# Patient Record
Sex: Male | Born: 1960 | Hispanic: No | Marital: Married | Smoking: Never smoker
Health system: Southern US, Community
[De-identification: ages and names within clinical notes are randomized; demographics above are authoritative.]

## PROBLEM LIST (undated history)

## (undated) DIAGNOSIS — E119 Type 2 diabetes mellitus without complications: Secondary | ICD-10-CM

## (undated) HISTORY — DX: Type 2 diabetes mellitus without complications: E11.9

---

## 2008-04-27 ENCOUNTER — Ambulatory Visit: Payer: Self-pay | Admitting: Cardiovascular Disease

## 2008-04-29 ENCOUNTER — Ambulatory Visit: Payer: Self-pay | Admitting: Cardiology

## 2008-05-18 ENCOUNTER — Ambulatory Visit: Payer: Self-pay

## 2008-05-18 ENCOUNTER — Encounter: Payer: Self-pay | Admitting: Cardiovascular Disease

## 2010-03-14 ENCOUNTER — Encounter
Admission: RE | Admit: 2010-03-14 | Discharge: 2010-03-19 | Payer: Self-pay | Source: Home / Self Care | Attending: Optometry | Admitting: Optometry

## 2010-05-15 IMAGING — CT CT ANGIO CHEST
2 of 7 series · 20 of 46 positions shown · IV contrast (Omnipaque 300)
Comparison: none

CLINICAL DATA: Chest pain.  Reason for exam: rule out PE.

CT ANGIOGRAPHY CHEST
TECHNIQUE: Multidetector CT imaging of the chest using the
standard protocol during bolus administration of intravenous
contrast. Multiplanar reconstructed images including MIPs were
obtained and reviewed to evaluate the vascular anatomy.
Contrast: 80 ml 8mnipaque-HJJ IV.

[Series 6: pe 1.0 b20f st · axial · 0.68mm/px · z∈[-253,-29]mm · 17 of 246 slices shown]
[im 11/246  lung]
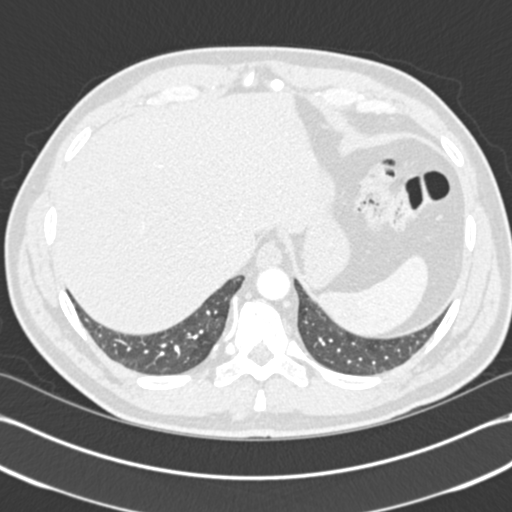
[im 22/246  soft-tissue]
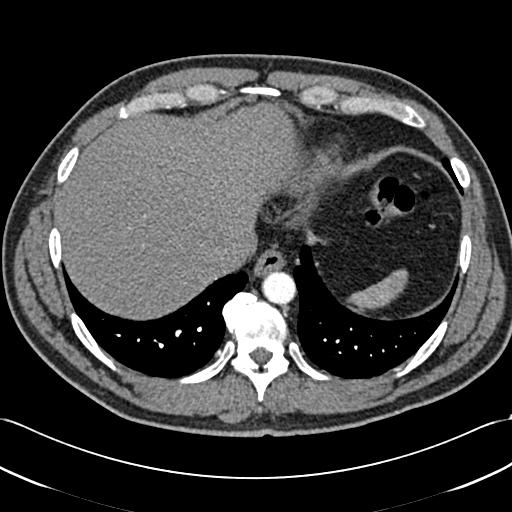
[im 43/246  lung]
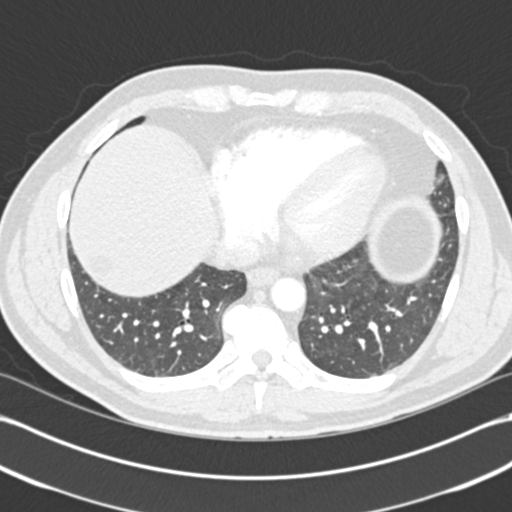
[im 54/246  soft-tissue]
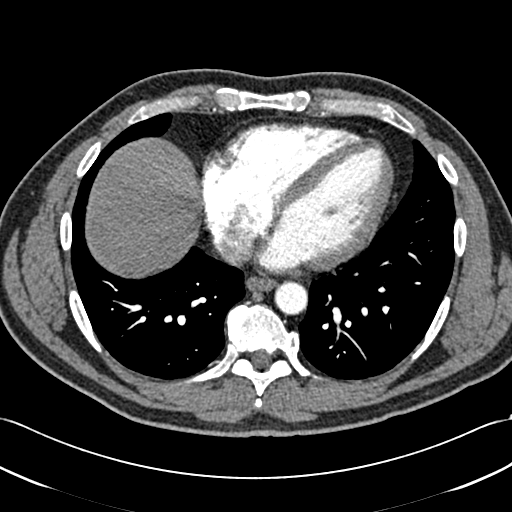
[im 64/246  lung]
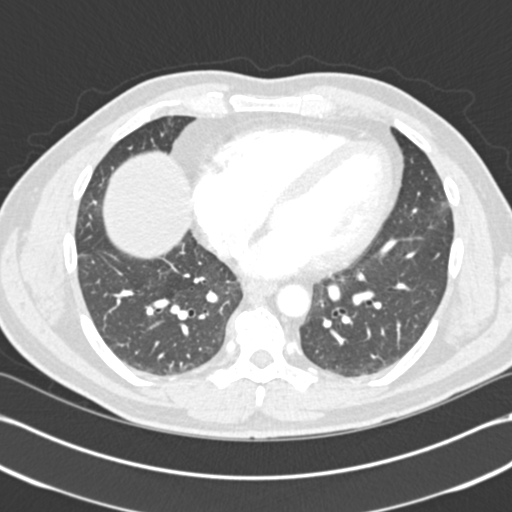
[im 86/246  soft-tissue]
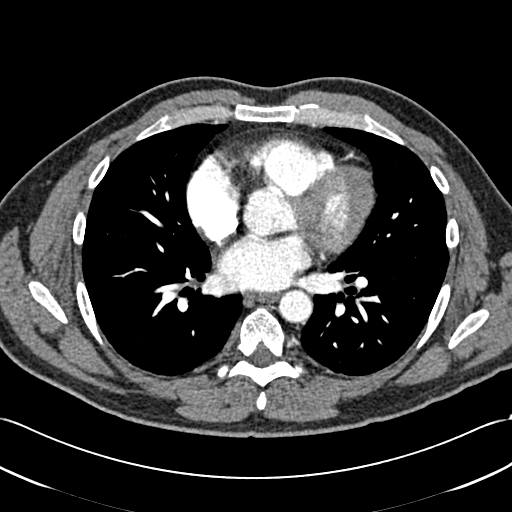
[im 96/246  lung]
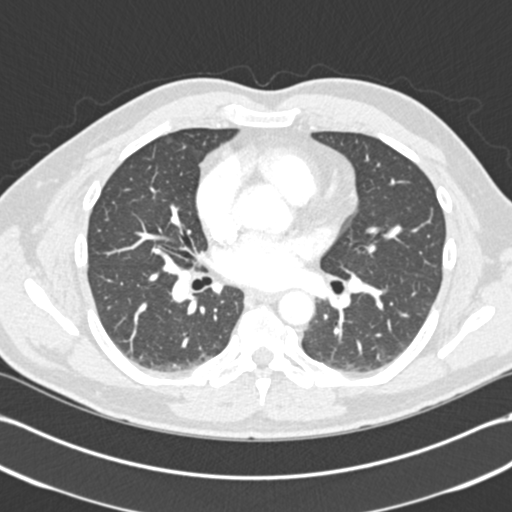
[im 107/246  soft-tissue]
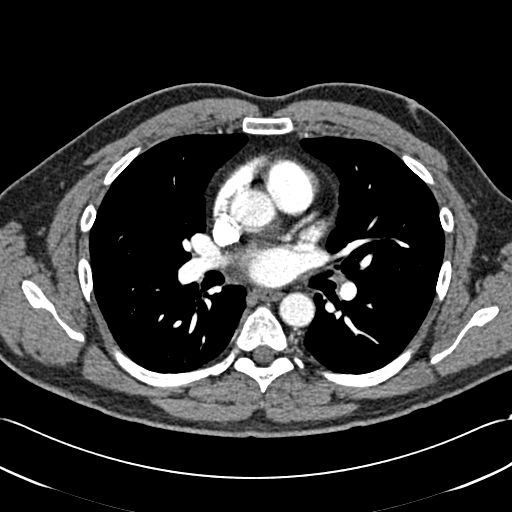
[im 128/246  lung]
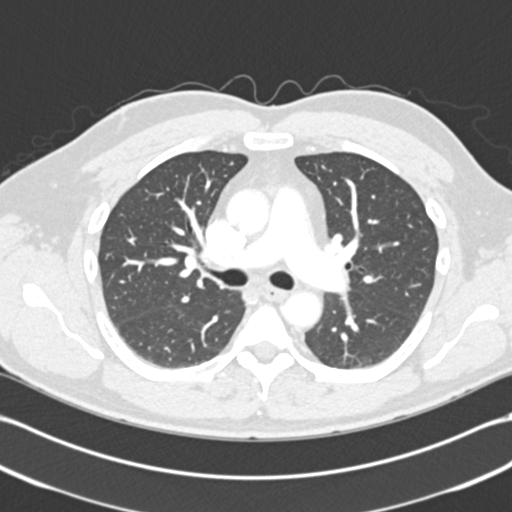
[im 139/246  soft-tissue]
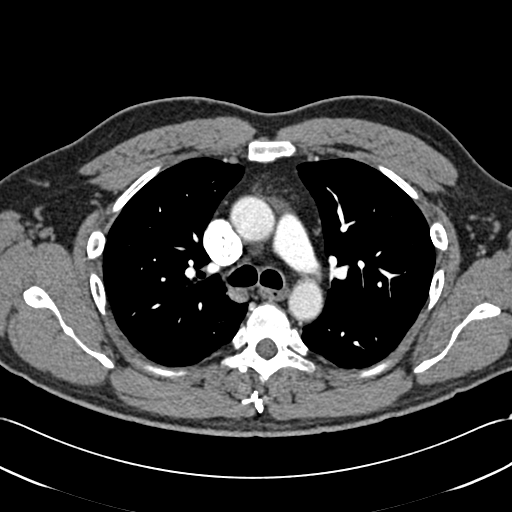
[im 150/246  lung]
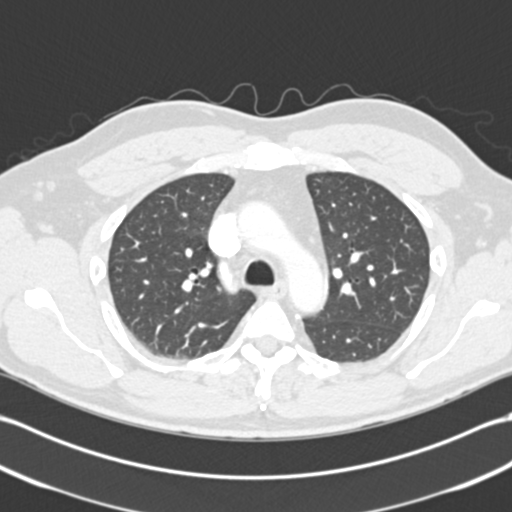
[im 160/246  soft-tissue]
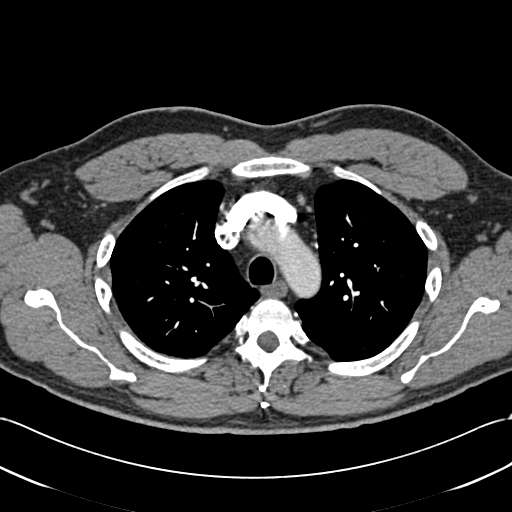
[im 182/246  lung]
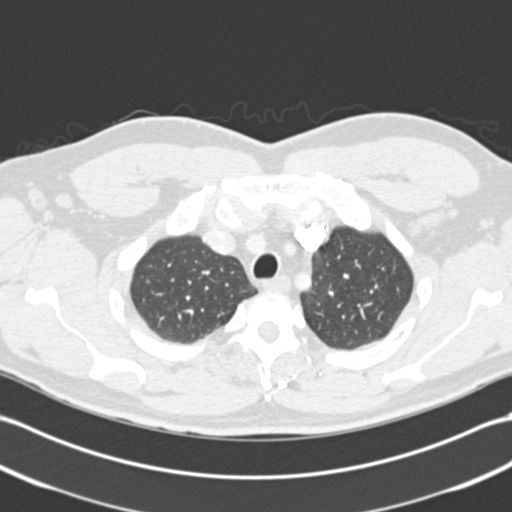
[im 192/246  soft-tissue]
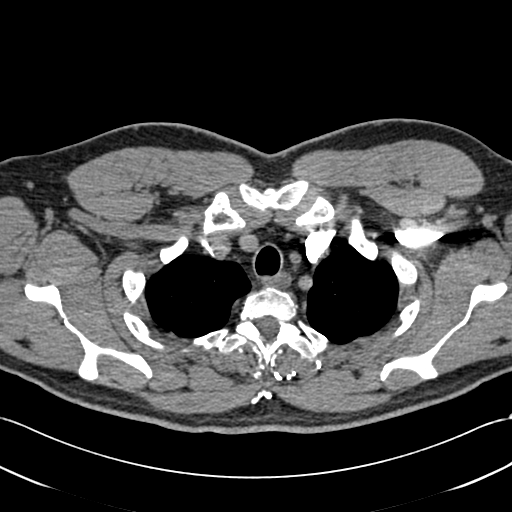
[im 203/246  lung]
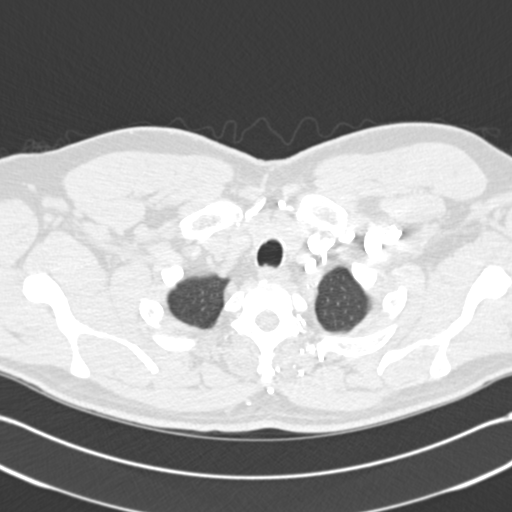
[im 224/246  soft-tissue]
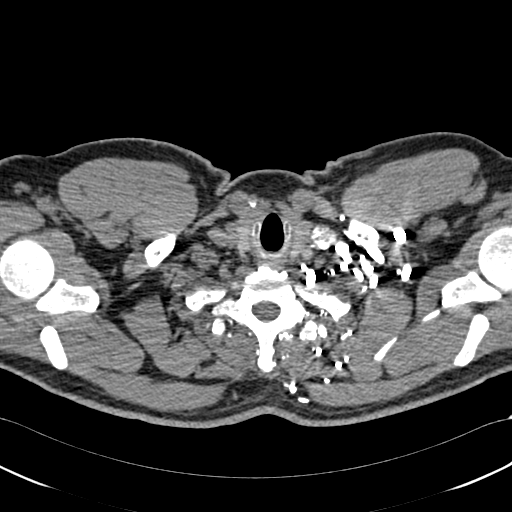
[im 235/246  lung]
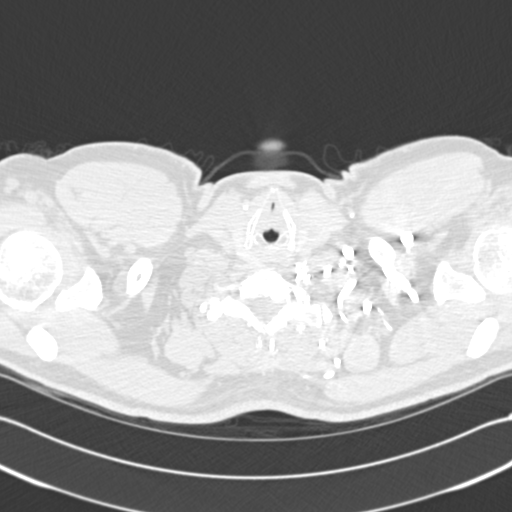

[Series 606: <mpr thick range> · coronal · 0.68mm/px · 3 of 113 slices shown]
[im 29/113  soft-tissue]
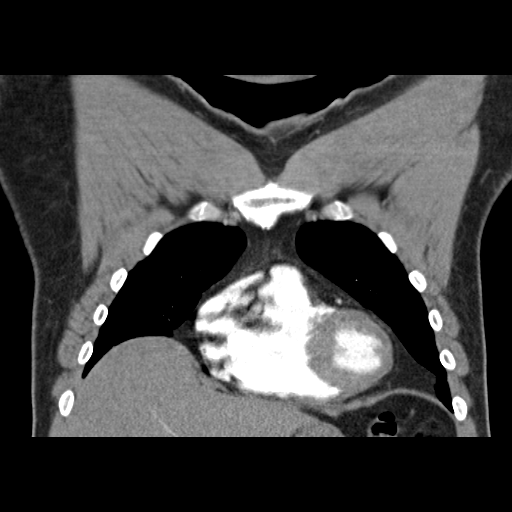
[im 57/113  soft-tissue]
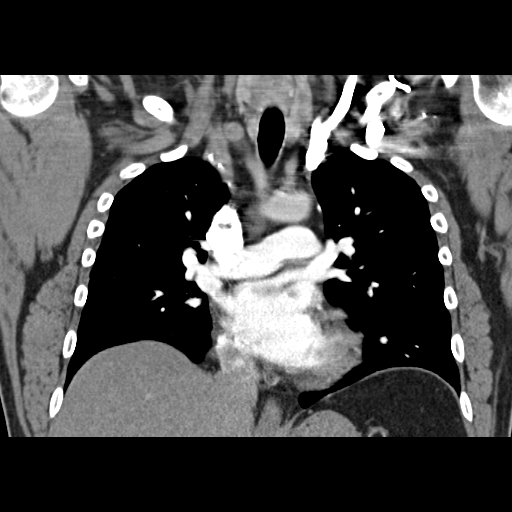
[im 85/113  soft-tissue]
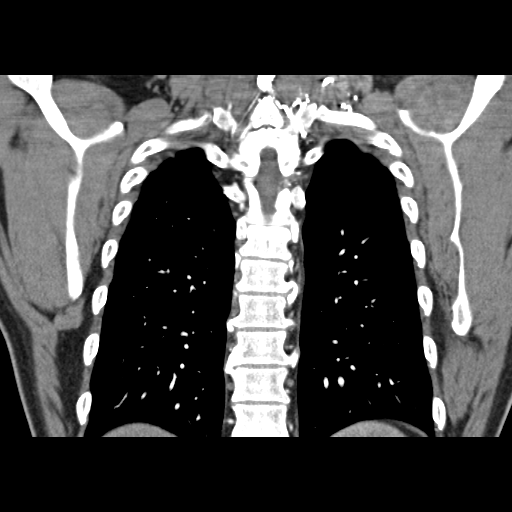

[20 of 46 positions shown; findings below may reference images not displayed]

FINDINGS: Negative for PE.  No acute pulmonary process.  Thoracic
aorta unremarkable.  No mediastinal mass or adenopathy. 1.6 cm cyst
in the superior aspect of the right hepatic lobe.
IMPRESSION: No acute chest findings.  Negative for PE.

## 2010-10-23 NOTE — Assessment & Plan Note (Signed)
[facility]                            CARDIOLOGY OFFICE NOTE   Warren, Quinn                           MRN:          [id number]  DATE:04/27/2008                            DOB:          01/19/1961    HISTORY OF PRESENT ILLNESS:  Warren Quinn is referred today by [facility].  The patient was seen there on November 16 for chest pain.  He is 50  years old.  He has coronary risk factors and positive family history.  He has had chest pain and shortness of breath of left 2-3 weeks.  He  denies any fever.  There is a mild pleuritic component to it.  There is  a mild positional component to it.  It does occur with exercise.  He had  a little bit of stress recently.  Apparently, there is some men selling  illegal meat around the neighborhood, and he had to call the sheriff's  department on them.   Other than this, he seems to be fairly level headed and not prone to  anxiety.  The patient's pain has been somewhat progressive over the last  2-3 weeks.  He was seen at [facility].  Chest x-ray showed no active  disease.  His blood work was fine.  He had no acute changes on his EKG,  and he was referred here.  The patient does not have a history of  asthma.  There has been no DVT or PE.  However, he clearly has pain with  movement and sometimes with a deep breath.  He has no previous history  of pericarditis or pleurisy.   The patient has not had a recent flu shot.   REVIEW OF SYSTEMS:  Otherwise, negative, in particular he has not had  palpitations or syncope.   PAST MEDICAL HISTORY:  Otherwise benign.  He has had some moles  surgically removed from his back, otherwise negative.   MEDICATIONS:  He takes no medications.   FAMILY HISTORY:  Unremarkable.   SOCIAL HISTORY:  He has a twin sister.  He is happily married.  He has  two older children.  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  He does not  smoke or drink.   ALLERGIES:  NO KNOWN DRUG ALLERGIES.   PHYSICAL EXAMINATION:  GENERAL:  Remarkable for healthy-appearing young  white male in no distress.  VITAL SIGNS:  Blood pressure is 128/86, pulse 77 and regular,  respiratory 14, afebrile, weight 201.  HEENT:  Unremarkable.  NECK:  Carotids are normal without bruit.  No lymphadenopathy,  thyromegaly, JVP elevation.  LUNGS:  Clear with diaphragmatic motion.  No wheezing.  HEART:  S1-S2 normal.  Heart sounds PMI normal.  ABDOMEN:  Benign.  Bowel sounds positive.  No AAA.  No tenderness, no  bruit, no hepatosplenomegaly or hepatojugular reflux  tenderness.  EXTREMITIES:  Distal pulses are intact.  No edema.  NEUROLOGICAL:  Nonfocal.  SKIN:  Warm and dry.  No muscular weakness.  Skin currently shows some  small moles on the back, but nothing that looks irregular.  IMPRESSION:  1. Chest pain, atypical, follow-up stress echo.  2. Shortness of breath with some pleuritic component.  Check CT, rule      out pulmonary emboli.  3. History of skin moles.  Back currently looks fine.  Follow up with      dermatologist on a yearly basis.  4. Stress.  The patient does admit to some stress.  I do not think he      needs any medication for this.  He seems to have a good coping      mechanism.  In particular, he enjoys his mission work and goes to      [place] twice a year.  As long as his chest CT and stress echo      were normal, he will be seen on a p.r.n. basis.     Warren Quinn. Warren Emms, MD, [facility]  Electronically Signed    PCN/MedQ  DD: 04/27/2008  DT: 04/27/2008  Job #: [id number]

## 2011-12-30 DIAGNOSIS — E119 Type 2 diabetes mellitus without complications: Secondary | ICD-10-CM | POA: Insufficient documentation

## 2012-04-13 DIAGNOSIS — E785 Hyperlipidemia, unspecified: Secondary | ICD-10-CM | POA: Insufficient documentation

## 2018-03-10 ENCOUNTER — Ambulatory Visit: Payer: Self-pay | Admitting: Nurse Practitioner

## 2018-03-10 DIAGNOSIS — E119 Type 2 diabetes mellitus without complications: Secondary | ICD-10-CM

## 2018-03-10 DIAGNOSIS — R110 Nausea: Secondary | ICD-10-CM

## 2018-03-10 LAB — GLUCOSE, FASTING: Blood Glucose, Fasting: 92

## 2018-03-10 NOTE — Patient Instructions (Addendum)
Diabetes Mellitus and Nutrition When you have diabetes (diabetes mellitus), it is very important to have healthy eating habits because your blood sugar (glucose) levels are greatly affected by what you eat and drink. Eating healthy foods in the appropriate amounts, at about the same times every day, can help you:  Control your blood glucose.  Lower your risk of heart disease.  Improve your blood pressure.  Reach or maintain a healthy weight.  Every person with diabetes is different, and each person has different needs for a meal plan. Your health care provider may recommend that you work with a diet and nutrition specialist (dietitian) to make a meal plan that is best for you. Your meal plan may vary depending on factors such as:  The calories you need.  The medicines you take.  Your weight.  Your blood glucose, blood pressure, and cholesterol levels.  Your activity level.  Other health conditions you have, such as heart or kidney disease.  How do carbohydrates affect me? Carbohydrates affect your blood glucose level more than any other type of food. Eating carbohydrates naturally increases the amount of glucose in your blood. Carbohydrate counting is a method for keeping track of how many carbohydrates you eat. Counting carbohydrates is important to keep your blood glucose at a healthy level, especially if you use insulin or take certain oral diabetes medicines. It is important to know how many carbohydrates you can safely have in each meal. This is different for every person. Your dietitian can help you calculate how many carbohydrates you should have at each meal and for snack. Foods that contain carbohydrates include:  Bread, cereal, rice, pasta, and crackers.  Potatoes and corn.  Peas, beans, and lentils.  Milk and yogurt.  Fruit and juice.  Desserts, such as cakes, cookies, ice cream, and candy.  How does alcohol affect me? Alcohol can cause a sudden decrease in blood  glucose (hypoglycemia), especially if you use insulin or take certain oral diabetes medicines. Hypoglycemia can be a life-threatening condition. Symptoms of hypoglycemia (sleepiness, dizziness, and confusion) are similar to symptoms of having too much alcohol. If your health care provider says that alcohol is safe for you, follow these guidelines:  Limit alcohol intake to no more than 1 drink per day for nonpregnant women and 2 drinks per day for men. One drink equals 12 oz of beer, 5 oz of wine, or 1 oz of hard liquor.  Do not drink on an empty stomach.  Keep yourself hydrated with water, diet soda, or unsweetened iced tea.  Keep in mind that regular soda, juice, and other mixers may contain a lot of sugar and must be counted as carbohydrates.  What are tips for following this plan? Reading food labels  Start by checking the serving size on the label. The amount of calories, carbohydrates, fats, and other nutrients listed on the label are based on one serving of the food. Many foods contain more than one serving per package.  Check the total grams (g) of carbohydrates in one serving. You can calculate the number of servings of carbohydrates in one serving by dividing the total carbohydrates by 15. For example, if a food has 30 g of total carbohydrates, it would be equal to 2 servings of carbohydrates.  Check the number of grams (g) of saturated and trans fats in one serving. Choose foods that have low or no amount of these fats.  Check the number of milligrams (mg) of sodium in one serving. Most people   should limit total sodium intake to less than 2,300 mg per day.  Always check the nutrition information of foods labeled as "low-fat" or "nonfat". These foods may be higher in added sugar or refined carbohydrates and should be avoided.  Talk to your dietitian to identify your daily goals for nutrients listed on the label. Shopping  Avoid buying canned, premade, or processed foods. These  foods tend to be high in fat, sodium, and added sugar.  Shop around the outside edge of the grocery store. This includes fresh fruits and vegetables, bulk grains, fresh meats, and fresh dairy. Cooking  Use low-heat cooking methods, such as baking, instead of high-heat cooking methods like deep frying.  Cook using healthy oils, such as olive, canola, or sunflower oil.  Avoid cooking with butter, cream, or high-fat meats. Meal planning  Eat meals and snacks regularly, preferably at the same times every day. Avoid going long periods of time without eating.  Eat foods high in fiber, such as fresh fruits, vegetables, beans, and whole grains. Talk to your dietitian about how many servings of carbohydrates you can eat at each meal.  Eat 4-6 ounces of lean protein each day, such as lean meat, chicken, fish, eggs, or tofu. 1 ounce is equal to 1 ounce of meat, chicken, or fish, 1 egg, or 1/4 cup of tofu.  Eat some foods each day that contain healthy fats, such as avocado, nuts, seeds, and fish. Lifestyle   Check your blood glucose regularly.  Exercise at least 30 minutes 5 or more days each week, or as told by your health care provider.  Take medicines as told by your health care provider.  Do not use any products that contain nicotine or tobacco, such as cigarettes and e-cigarettes. If you need help quitting, ask your health care provider.  Work with a Veterinary surgeon or diabetes educator to identify strategies to manage stress and any emotional and social challenges. What are some questions to ask my health care provider?  Do I need to meet with a diabetes educator?  Do I need to meet with a dietitian?  What number can I call if I have questions?  When are the best times to check my blood glucose? Where to find more information:  American Diabetes Association: diabetes.org/food-and-fitness/food  Academy of Nutrition and Dietetics:  https://www.vargas.com/  [employer] of Diabetes and Digestive and Kidney Diseases (NIH): FindJewelers.cz Summary  A healthy meal plan will help you control your blood glucose and maintain a healthy lifestyle.  Working with a diet and nutrition specialist (dietitian) can help you make a meal plan that is best for you.  Keep in mind that carbohydrates and alcohol have immediate effects on your blood glucose levels. It is important to count carbohydrates and to use alcohol carefully. This information is not intended to replace advice given to you by your health care provider. Make sure you discuss any questions you have with your health care provider. Document Released: 02/21/2005 Document Revised: 07/01/2016 Document Reviewed: 07/01/2016 Elsevier Interactive Patient Education  2018 [employer].  FBG done today Consider seeing a nutritionist and discuss with PCP if he feels he wants to know about heatlhier food choices; if a1c hasn't improved. Healthy eating habits and to continue to exercise as tolerated Continue routine follow up with dentist/ophthalmology Keep appt with PCP next week for f/u RTC PRN

## 2018-03-10 NOTE — Progress Notes (Signed)
   Subjective:    Patient ID: Warren Quinn, male    DOB: 03-28-1961, 57 y.o.   MRN: [id number]  HPI Marcas is here today and wants to discuss his diabetes and to have his glucose checked. He denies any dizziness, blurred vision, c/p, SOB, excessive thirst/urination or hunger. Denies any numbness or tingling in hands or feet but reports he's had this at times in the past. He is to f/u with PCP next week for routing eval of diabetes/hyperlipidemia. His job is very active working in maintenance, and he exercises daily before work for 20 minutes. Reports tried to follow low carb diet and had cut out breads, doesn't drink any sugar drinks but has unsweetened tea.   C/o some nausea last week which resolved 2 days ago and denies any abdominal pain and has regular bowel patterns. He reports he was under a lot of stress last week and denies any new foods   Takes his meds as directed and tolerates. Although admits takes metformin 2 twice daily; "I don't think I could handle them big pills at one time"   Review of Systems  Constitutional: Negative for fatigue, fever and unexpected weight change.  Respiratory: Negative for shortness of breath.   Cardiovascular: Negative for chest pain.  Gastrointestinal: Negative for abdominal pain.       Nausea resolved  Endocrine: Negative for polydipsia, polyphagia and polyuria.       Objective:   Physical Exam  Constitutional: He is oriented to person, place, and time. He appears well-developed and well-nourished.  HENT:  Head: Normocephalic.  Neck: Normal range of motion.  Cardiovascular: Normal rate, regular rhythm and normal heart sounds.  No murmur heard. No carotid bruits, no lower extremity edema  Pulmonary/Chest: Effort normal and breath sounds normal. No respiratory distress.  Neurological: He is alert and oriented to person, place, and time.  Skin: Skin is warm and dry.  Psychiatric: He has a normal mood and affect.  Vitals reviewed.          Assessment & Plan:

## 2018-05-21 ENCOUNTER — Ambulatory Visit: Payer: Self-pay | Admitting: Nurse Practitioner

## 2018-05-21 VITALS — BP 142/78 | HR 83 | Temp 98.1°F | Resp 16

## 2018-05-21 DIAGNOSIS — L089 Local infection of the skin and subcutaneous tissue, unspecified: Secondary | ICD-10-CM

## 2018-05-21 MED ORDER — MUPIROCIN 2 % EX OINT: TOPICAL_OINTMENT | CUTANEOUS | 0 refills | Status: DC

## 2018-05-21 NOTE — Patient Instructions (Signed)
Keep area clean and dry Apply topical antibiotic as directed If no improvement return to clinic Return to clinic next week for fasting glucose

## 2018-05-21 NOTE — Progress Notes (Signed)
   Subjective:    Patient ID: Warren Quinn, male    DOB: May 08, 1961, 57 y.o.   MRN: [id number]  HPI Warren Quinn is here today with c/o scratch to right forearm that is reddened and was tender to touch but has resolved. Admits tetanus in last 5 years and chart review shows 12/29/2014. Denies SOB, c/p, drainage to area or fever. Has applied vaseline with no relief.  Wanted to get glucose checked but has just eaten.    Review of Systems  Constitutional: Negative for activity change and fever.  Skin: Positive for color change.       Right forearm scratch that is reddened       Objective:   Physical Exam Constitutional:      Appearance: Normal appearance.  HENT:     Head: Normocephalic.  Neck:     Musculoskeletal: Neck supple.  Cardiovascular:     Rate and Rhythm: Normal rate.  Skin:    General: Skin is warm and dry.     Comments: Right forearm abrasion to posterior aspect that has erythema along abrasion. No drainage, tenderness on palpation noted.  Neurological:     Mental Status: He is alert.  Psychiatric:        Mood and Affect: Mood normal.           Assessment & Plan:

## 2018-05-26 ENCOUNTER — Ambulatory Visit: Payer: Self-pay | Admitting: Nurse Practitioner

## 2018-05-26 VITALS — BP 138/82 | HR 66 | Temp 98.1°F | Resp 16

## 2018-05-26 DIAGNOSIS — E119 Type 2 diabetes mellitus without complications: Secondary | ICD-10-CM

## 2018-05-26 LAB — POCT CBG (FASTING - GLUCOSE)-MANUAL ENTRY: Glucose Fasting, POC: 121 mg/dL — AB (ref 70–99)

## 2018-05-26 NOTE — Patient Instructions (Addendum)
Follow up with your provider next month for routine chronic office visit Continue all meds as directed Continue to exercise as tolerated and modify carb intake Your fasting glucose was 121 today Return to clinic as needed

## 2018-05-26 NOTE — Progress Notes (Signed)
   Subjective:    Patient ID: Warren FisherJack M Quinn, male    DOB: 14-May-1961, 57 y.o.   MRN: [id number]  HPI Warren Quinn is here this morning to get his glucose checked and he is fasting. He admits he hasn't eaten very well but is trying to be mindful of high carb foods. Continues to exercise daily. Was seen last week for skin infection to left forearm and area looking much better. Denies any excessive urination, thirst or urinary frequency. Denies any fever. Admits takes meds prescribed by PCP as directed and tolerating.  Review of Systems  Constitutional: Negative for chills, fatigue and fever.  Endocrine: Negative for polydipsia and polyuria.  Skin:       Right arm scratch is better       Objective:   Physical Exam Constitutional:      General: He is not in acute distress.    Appearance: Normal appearance. He is not ill-appearing.  HENT:     Head: Normocephalic.  Neck:     Musculoskeletal: Normal range of motion and neck supple.  Pulmonary:     Effort: Pulmonary effort is normal.  Musculoskeletal:     Comments: Normal gait  Skin:    General: Skin is warm.     Comments: Right forearm abrasion improving with some erythema still present along the linear abrasion but scabbing. No tenderness, drainage, or edema.  Neurological:     Mental Status: He is alert and oriented to person, place, and time.  Psychiatric:        Mood and Affect: Mood normal.           Assessment & Plan:

## 2018-05-26 NOTE — Progress Notes (Deleted)
f °

## 2019-08-24 ENCOUNTER — Ambulatory Visit: Payer: Self-pay | Admitting: Family Medicine

## 2019-08-24 VITALS — BP 130/82 | HR 77 | Ht 70.0 in | Wt 190.0 lb

## 2019-08-24 DIAGNOSIS — Z789 Other specified health status: Secondary | ICD-10-CM

## 2019-08-24 NOTE — Progress Notes (Signed)
Subjective:     Patient ID: Warren Quinn, male   DOB: 06-19-1960, 59 y.o.   MRN: [id number]  HPI Warren Quinn presents to the employee health clinic today for his required wellness visit for his insurance. His PCP is Warren Quinn, [place], who he sees regularly for management of his T2DM. He reports his diabetes is under good control. He gets regular eye exams. His colonoscopy and prostate cancer screening is UTD. He denies any current problems or concerns.   Past Medical History:  Diagnosis Date  . Diabetes mellitus without complication (HCC)    No Known Allergies  Current Outpatient Medications:  .  atorvastatin (LIPITOR) 10 MG tablet, TAKE 1 TABLET BY MOUTH AT BEDTIME, Disp: , Rfl:  .  lisinopril (PRINIVIL,ZESTRIL) 5 MG tablet, TAKE 1 TABLET (5 MG TOTAL) BY MOUTH DAILY., Disp: , Rfl:  .  metFORMIN (GLUCOPHAGE-XR) 500 MG 24 hr tablet, Take 4 tabs once a day, Disp: , Rfl:  .  Blood Glucose Monitoring Suppl DEVI, by Does not apply route., Disp: , Rfl:  .  glucose blood test strip, 1 each by Other route 2 (two) times daily. Use as instructed, Disp: , Rfl:  .  mupirocin ointment (BACTROBAN) 2 %, Apply to affected area twice daily x 7 days, Disp: 22 g, Rfl: 0     Review of Systems  Constitutional: Negative for chills, fatigue, fever and unexpected weight change.  HENT: Negative for congestion, ear pain, sinus pressure, sinus pain and sore throat.   Eyes: Negative for discharge and visual disturbance.  Respiratory: Negative for cough, shortness of breath and wheezing.   Cardiovascular: Negative for chest pain and leg swelling.  Gastrointestinal: Negative for abdominal pain, blood in stool, constipation, diarrhea, nausea and vomiting.  Genitourinary: Negative for difficulty urinating and hematuria.  Skin: Negative for color change.  Neurological: Negative for dizziness, weakness, light-headedness and headaches.  Hematological: Negative for adenopathy.  All other systems reviewed and are  negative.      Objective:   Physical Exam Vitals reviewed.  Constitutional:      General: He is not in acute distress.    Appearance: Normal appearance. He is well-developed.  HENT:     Head: Normocephalic and atraumatic.  Eyes:     General:        Right eye: No discharge.        Left eye: No discharge.  Cardiovascular:     Rate and Rhythm: Normal rate and regular rhythm.     Heart sounds: Normal heart sounds.  Pulmonary:     Effort: Pulmonary effort is normal. No respiratory distress.     Breath sounds: Normal breath sounds.  Musculoskeletal:     Cervical back: Neck supple.  Skin:    General: Skin is warm and dry.  Neurological:     Mental Status: He is alert and oriented to person, place, and time.  Psychiatric:        Mood and Affect: Mood normal.        Behavior: Behavior normal.    Today's Vitals   08/24/19 1332  BP: 130/82  Pulse: 77  SpO2: 96%  Weight: 190 lb (86.2 kg)  Height: 5\' 10"  (1.778 m)   Body mass index is 27.26 kg/m.     Assessment:     Participant in health and wellness plan      Plan:     1. Keep all regular appts with PCP 2. Encouraged continued efforts at healthy diet and exercise.  3.  F/u here prn.

## 2019-11-02 ENCOUNTER — Ambulatory Visit: Payer: Self-pay | Admitting: Family Medicine

## 2019-11-02 VITALS — BP 124/84 | HR 71

## 2019-11-02 DIAGNOSIS — M79674 Pain in right toe(s): Secondary | ICD-10-CM

## 2019-11-02 DIAGNOSIS — E119 Type 2 diabetes mellitus without complications: Secondary | ICD-10-CM

## 2019-11-02 NOTE — Progress Notes (Signed)
Subjective:     Patient ID: Warren Quinn, male   DOB: [birth year], 59 y.o.   MRN: [id number]  HPI Shahzad presents to the employee health clinic today for evaluation of right fourth toe pain. He reports he jammed it on his rocking chair yesterday at home. Able to ambulate without problem. Reports swelling and bruising to right fourth toe, pain at the DIP joint. Denies numbness or tingling.   Brenton would also like some lab work done to check on his Type 2 diabetes. He states he needs to schedule a f/u appt with his PCP and would like to have lab work done prior. He reports he is compliant with his medication. Tries to eat healthy and exercise. Reports his fasting blood sugars at home are around 130. Denies any problems with low blood sugars.   Past Medical History:  Diagnosis Date  . Diabetes mellitus without complication (HCC)    No Known Allergies  Current Outpatient Medications:  .  atorvastatin (LIPITOR) 10 MG tablet, TAKE 1 TABLET BY MOUTH AT BEDTIME, Disp: , Rfl:  .  lisinopril (PRINIVIL,ZESTRIL) 5 MG tablet, TAKE 1 TABLET (5 MG TOTAL) BY MOUTH DAILY., Disp: , Rfl:  .  metFORMIN (GLUCOPHAGE-XR) 500 MG 24 hr tablet, Take 4 tabs once a day, Disp: , Rfl:  .  Blood Glucose Monitoring Suppl DEVI, by Does not apply route., Disp: , Rfl:  .  glucose blood test strip, 1 each by Other route 2 (two) times daily. Use as instructed, Disp: , Rfl:  .  mupirocin ointment (BACTROBAN) 2 %, Apply to affected area twice daily x 7 days, Disp: 22 g, Rfl: 0     Review of Systems  Constitutional: Negative for chills and fever.  Respiratory: Negative for cough, shortness of breath and wheezing.   Cardiovascular: Negative for chest pain and leg swelling.  Musculoskeletal: Positive for joint swelling (see hpi).  Neurological: Negative for dizziness, syncope, weakness and numbness.       Objective:   Physical Exam Vitals reviewed.  Constitutional:      General: He is not in acute distress.  Appearance: Normal appearance. He is not toxic-appearing.  HENT:     Head: Normocephalic and atraumatic.  Eyes:     General:        Right eye: No discharge.        Left eye: No discharge.  Cardiovascular:     Rate and Rhythm: Normal rate and regular rhythm.     Heart sounds: Normal heart sounds.  Pulmonary:     Effort: Pulmonary effort is normal. No respiratory distress.     Breath sounds: Normal breath sounds.  Musculoskeletal:     Cervical back: Neck supple.     Comments: Right fourth toe with edema and ecchymosis. Skin intact, no erythema or increased warmth to touch. Tender to palpation to right fourth DIP joint. Sensation intact. Ambulates without difficulty.   Skin:    General: Skin is warm and dry.  Neurological:     Mental Status: He is alert and oriented to person, place, and time.  Psychiatric:        Mood and Affect: Mood normal.        Behavior: Behavior normal.    Today's Vitals   11/02/19 0836  BP: 124/84  Pulse: 71  SpO2: 97%   There is no height or weight on file to calculate BMI.      Assessment:     1. Pain of toe of right foot  Discussed with patient that an x-ray is unlikely to be beneficial at this time. Recommend conservative treatment with buddy taping of the toe for increased comfort and support, application of ice and elevating the extremity as much as possible. May take otc acetaminophen for pain relief prn. If symptoms worsen or he sees no signs of improvement he should be seen by either his PCP or urgent care and obtain an x-ray at that time. He verbalized understanding.   2. Type 2 diabetes mellitus without complication, without long-term current use of insulin (HCC) Recommend obtaining lab work next week when he is fasting.  Will check lipid panel, CBC, CMP, A1c, and PSA.      Plan:     See above.

## 2019-11-16 ENCOUNTER — Other Ambulatory Visit: Payer: Self-pay | Admitting: Family Medicine

## 2019-11-17 LAB — COMPLETE METABOLIC PANEL WITH GFR
AG Ratio: 2.3 (calc) (ref 1.0–2.5)
ALT: 18 U/L (ref 9–46)
AST: 14 U/L (ref 10–35)
Albumin: 4.3 g/dL (ref 3.6–5.1)
Alkaline phosphatase (APISO): 68 U/L (ref 35–144)
BUN: 20 mg/dL (ref 7–25)
CO2: 23 mmol/L (ref 20–32)
Calcium: 9.3 mg/dL (ref 8.6–10.3)
Chloride: 107 mmol/L (ref 98–110)
Creat: 0.88 mg/dL (ref 0.70–1.25)
GFR, Est African American: 108 mL/min/{1.73_m2} (ref >=60)
GFR, Est Non African American: 93 mL/min/{1.73_m2} (ref >=60)
Globulin: 1.9 g/dL (calc) (ref 1.9–3.7)
Glucose, Bld: 115 mg/dL — ABNORMAL HIGH (ref 65–99)
Potassium: 4.4 mmol/L (ref 3.5–5.3)
Sodium: 139 mmol/L (ref 135–146)
Total Bilirubin: 0.7 mg/dL (ref 0.2–1.2)
Total Protein: 6.2 g/dL (ref 6.1–8.1)

## 2019-11-17 LAB — CBC WITH DIFFERENTIAL/PLATELET
Absolute Monocytes: 434 cells/uL (ref 200–950)
Basophils Absolute: 20 cells/uL (ref 0–200)
Basophils Relative: 0.4 %
Eosinophils Absolute: 51 cells/uL (ref 15–500)
Eosinophils Relative: 1 %
HCT: 45.8 % (ref 38.5–50.0)
Hemoglobin: 15.4 g/dL (ref 13.2–17.1)
Lymphs Abs: 1601 cells/uL (ref 850–3900)
MCH: 30 pg (ref 27.0–33.0)
MCHC: 33.6 g/dL (ref 32.0–36.0)
MCV: 89.1 fL (ref 80.0–100.0)
MPV: 10.2 fL (ref 7.5–12.5)
Monocytes Relative: 8.5 %
Neutro Abs: 2994 cells/uL (ref 1500–7800)
Neutrophils Relative %: 58.7 %
Platelets: 224 10*3/uL (ref 140–400)
RBC: 5.14 10*6/uL (ref 4.20–5.80)
RDW: 13 % (ref 11.0–15.0)
Total Lymphocyte: 31.4 %
WBC: 5.1 10*3/uL (ref 3.8–10.8)

## 2019-11-17 LAB — LIPID PANEL
Cholesterol: 122 mg/dL (ref <200)
HDL: 35 mg/dL — ABNORMAL LOW (ref >=40)
LDL Cholesterol (Calc): 71 mg/dL (calc)
Non-HDL Cholesterol (Calc): 87 mg/dL (calc) (ref <130)
Total CHOL/HDL Ratio: 3.5 (calc) (ref <5.0)
Triglycerides: 75 mg/dL (ref <150)

## 2019-11-17 LAB — PSA: PSA: 1.7 ng/mL (ref <=4.0)

## 2019-11-17 LAB — HEMOGLOBIN A1C W/OUT EAG: Hgb A1c MFr Bld: 6.6 % of total Hgb — ABNORMAL HIGH (ref <5.7)

## 2019-11-18 NOTE — Progress Notes (Signed)
Warren Quinn,  Lab work looks good.   Your cholesterol is under good control with your current medication. Your HDL, or the good cholesterol, is on the low side. This can be improved with increased physical activity.   Your diabetes is under good control with your current medication. Your A1c is at 6.6% which is below the goal of 7%. So that is great!   Your liver function, kidney function, blood counts, and PSA (which looks at your risk of prostate cancer) are all normal.   Please share these results with your primary care provider. If you have any questions please let me know!  Take care,  Jeannine Boga, NP

## 2020-02-22 ENCOUNTER — Ambulatory Visit: Payer: Self-pay | Admitting: Family Medicine

## 2020-02-22 VITALS — BP 126/80 | HR 85

## 2020-02-22 DIAGNOSIS — E119 Type 2 diabetes mellitus without complications: Secondary | ICD-10-CM

## 2020-02-22 MED ORDER — LISINOPRIL 5 MG PO TABS: ORAL_TABLET | ORAL | 1 refills | Status: DC

## 2020-02-22 MED ORDER — METFORMIN HCL ER 500 MG PO TB24: ORAL_TABLET | ORAL | 1 refills | Status: DC

## 2020-02-22 NOTE — Progress Notes (Signed)
  Subjective:     Patient ID: Warren Quinn, male   DOB: 04-03-1961, 59 y.o.   MRN: [id number]  HPI Jeremia presents to the employee health clinic today for short-term refills of his chronic medications. He has not been able to see his PCP d/t his work schedule. He is working on getting an appointment scheduled now.   He reports compliance with his current medications. Denies any adverse effects. He reports his fasting blood sugars have been in the 120s. He continues to work on healthy eating and exercise.   Review of Systems  Constitutional: Negative for activity change, appetite change, fatigue, fever and unexpected weight change.  HENT: Negative.   Eyes: Negative for photophobia and visual disturbance.  Respiratory: Negative for cough and shortness of breath.   Cardiovascular: Negative for chest pain, palpitations and leg swelling.  Gastrointestinal: Negative for abdominal pain, diarrhea, nausea and vomiting.  Genitourinary: Negative.   Musculoskeletal: Negative.   Neurological: Negative.        Objective:   Physical Exam Vitals reviewed.  Constitutional:      General: He is not in acute distress.    Appearance: Normal appearance. He is well-developed.  HENT:     Head: Normocephalic and atraumatic.  Eyes:     General:        Right eye: No discharge.        Left eye: No discharge.  Cardiovascular:     Rate and Rhythm: Normal rate and regular rhythm.     Heart sounds: Normal heart sounds.  Pulmonary:     Effort: Pulmonary effort is normal. No respiratory distress.     Breath sounds: Normal breath sounds.  Skin:    General: Skin is warm and dry.  Neurological:     Mental Status: He is alert and oriented to person, place, and time.  Psychiatric:        Mood and Affect: Mood normal.        Behavior: Behavior normal.      Today's Vitals   02/22/20 1410  BP: 126/80  Pulse: 85  SpO2: 98%   There is no height or weight on file to calculate BMI.     Assessment:      Type 2 diabetes mellitus without complication, without long-term current use of insulin (HCC)      Plan:     1. Lisinopril and metformin refilled. Pt states he does not need a refill on his atorvastatin at this time. Lab results from June reviewed. He should f/u with his PCP for further refills.

## 2020-03-02 ENCOUNTER — Other Ambulatory Visit: Payer: Self-pay

## 2020-03-02 ENCOUNTER — Ambulatory Visit
Admission: EM | Admit: 2020-03-02 | Discharge: 2020-03-02 | Disposition: A | Discharge status: Home / Self Care | Payer: PRIVATE HEALTH INSURANCE | Attending: Emergency Medicine | Admitting: Emergency Medicine

## 2020-03-02 ENCOUNTER — Ambulatory Visit: Payer: Self-pay | Admitting: Family Medicine

## 2020-03-02 VITALS — BP 138/90 | HR 74

## 2020-03-02 DIAGNOSIS — R42 Dizziness and giddiness: Secondary | ICD-10-CM

## 2020-03-02 DIAGNOSIS — H539 Unspecified visual disturbance: Secondary | ICD-10-CM

## 2020-03-02 MED ORDER — MECLIZINE HCL 25 MG PO TABS: 25.0000 mg | ORAL_TABLET | Freq: Three times a day (TID) | ORAL | 0 refills | Status: DC | PRN

## 2020-03-02 NOTE — ED Provider Notes (Signed)
[name]    CSN: [id number] Arrival date & time: 03/02/20  1224      History   Chief Complaint Chief Complaint  Patient presents with   Dizziness    since last    HPI Warren Quinn is a 59 y.o. male history of DM type II, hyperlipidemia presenting today for evaluation of dizziness.  Reports last night he laid down in felt an episode of dizziness which she described as spinning.  Symptoms lasted approximately 15 minutes.  Had associated fatigue.  Denies associated vision changes nausea or vomiting.  Denies associated headaches.  He fell asleep and has not had recurrent spinning sensation since.  This morning he went to work and was working for approximately 3 to 4 hours.  Does report he was climbing ladders and exerting himself and began to feel see some stars in his eyes bilaterally.  Denies blackening blurring or double vision.  Denies headaches at this time.  Denies chest pain or shortness of breath.  He rested and symptoms resolved as well.  He checked his sugar this morning was 144, after episode of seeing stars noted sugars to be 112.  Typically his sugars are around 120.  He reports minimal oral intake today.  No alcohol or tobacco use.  Denies history of hypertension.  Denies history of other cardiac problems.  Denies prior DVT/PE.  Denies leg pain or leg swelling.  Denies recent URI or sickness.   HPI  Past Medical History:  Diagnosis Date   Diabetes mellitus without complication [facility])     Patient Active Problem List   Diagnosis Date Noted   Hyperlipidemia 04/13/2012   Type 2 diabetes mellitus (HCC) 12/30/2011    History reviewed. No pertinent surgical history.     Home Medications    Prior to Admission medications   Medication Sig Start Date End Date Taking? Authorizing Provider  atorvastatin (LIPITOR) 10 MG tablet TAKE 1 TABLET BY MOUTH AT BEDTIME 02/17/18  Yes [provider]  Blood Glucose Monitoring Suppl DEVI by Does not apply  route. 07/19/13  Yes [provider]  glucose blood test strip 1 each by Other route 2 (two) times daily. Use as instructed 09/23/16  Yes [provider]  lisinopril (ZESTRIL) 5 MG tablet TAKE 1 TABLET (5 MG TOTAL) BY MOUTH DAILY. 02/22/20  Yes Jeannine Boga, NP  metFORMIN (GLUCOPHAGE-XR) 500 MG 24 hr tablet Take 4 tabs once a day 02/22/20  Yes Jeannine Boga, NP  mupirocin ointment (BACTROBAN) 2 % Apply to affected area twice daily x 7 days 05/21/18  Yes Marnette Burgess, NP  meclizine (ANTIVERT) 25 MG tablet Take 1 tablet (25 mg total) by mouth 3 (three) times daily as needed for dizziness. 03/02/20   Seniah Lawrence, Junius Creamer, PA-C    Family History History reviewed. No pertinent family history.  Social History Social History   Tobacco Use   Smoking status: Never Smoker   Smokeless tobacco: Never Used  Building services engineer Use: Never used  Substance Use Topics   Alcohol use: Not Currently   Drug use: Never     Allergies   Patient has no known allergies.   Review of Systems Review of Systems  Constitutional: Negative for fatigue and fever.  HENT: Negative for congestion, sinus pressure and sore throat.   Eyes: Negative for photophobia, pain and visual disturbance.  Respiratory: Negative for cough and shortness of breath.   Cardiovascular: Negative for chest pain.  Gastrointestinal: Negative for abdominal pain,  nausea and vomiting.  Genitourinary: Negative for decreased urine volume and hematuria.  Musculoskeletal: Negative for myalgias, neck pain and neck stiffness.  Neurological: Positive for dizziness, light-headedness and headaches. Negative for syncope, facial asymmetry, speech difficulty, weakness and numbness.     Physical Exam Triage Vital Signs ED Triage Vitals  Enc Vitals Group     BP 03/02/20 1325 131/86     Pulse Rate 03/02/20 1325 62     Resp 03/02/20 1325 17     Temp 03/02/20 1325 98.3 F (36.8 C)     Temp Source 03/02/20 1325 Oral      SpO2 03/02/20 1325 97 %     Weight --      Height --      Head Circumference --      Peak Flow --      Pain Score 03/02/20 1327 0     Pain Loc --      Pain Edu? --      Excl. in GC? --    No data found.  Updated Vital Signs BP 131/86 (BP Location: Left Arm)    Pulse 62    Temp 98.3 F (36.8 C) (Oral)    Resp 17    SpO2 97%   Visual Acuity Right Eye Distance:   Left Eye Distance:   Bilateral Distance:    Right Eye Near:   Left Eye Near:    Bilateral Near:     Physical Exam Vitals and nursing note reviewed.  Constitutional:      Appearance: He is well-developed.     Comments: No acute distress  HENT:     Head: Normocephalic and atraumatic.     Ears:     Comments: Bilateral ears without tenderness to palpation of external auricle, tragus and mastoid, EAC's without erythema or swelling, TM's with good bony landmarks and cone of light. Non erythematous.     Nose: Nose normal.     Mouth/Throat:     Comments: Oral mucosa pink and moist, no tonsillar enlargement or exudate. Posterior pharynx patent and nonerythematous, no uvula deviation or swelling. Normal phonation. Eyes:     Conjunctiva/sclera: Conjunctivae normal.  Cardiovascular:     Rate and Rhythm: Normal rate.  Pulmonary:     Effort: Pulmonary effort is normal. No respiratory distress.     Comments: Breathing comfortably at rest, CTABL, no wheezing, rales or other adventitious sounds auscultated Abdominal:     General: There is no distension.  Musculoskeletal:        General: Normal range of motion.     Cervical back: Neck supple.  Skin:    General: Skin is warm and dry.  Neurological:     General: No focal deficit present.     Mental Status: He is alert and oriented to person, place, and time. Mental status is at baseline.     Cranial Nerves: No cranial nerve deficit.     Comments: Patient A&O x3, cranial nerves II-XII grossly intact, strength at shoulders, hips and knees 5/5, equal bilaterally.  Negative  Romberg and Pronator Drift. Gait without abnormality.      UC Treatments / Results  Labs (all labs ordered are listed, but only abnormal results are displayed) Labs Reviewed - No data to display  EKG   Radiology No results found.  Procedures Procedures (including critical care time)  Medications Ordered in UC Medications - No data to display  Initial Impression / Assessment and Plan / UC Course  I have reviewed  the triage vital signs and the nursing notes.  Pertinent labs & imaging results that were available during my care of the patient were reviewed by me and considered in my medical decision making (see chart for details).     Vital signs stable, no neuro deficits on exam today.  Dizziness last night more consistent with vertigo, dizziness earlier today seems more lightheadedness/vaso vagal/presyncope.  Currently asymptomatic.  Sugar stable, blood pressure stable.  Without any chest pain or shortness of breath, low suspicion of ACS.  Strength intact.  Negative Romberg.  Recommending rest and fluids.  Declined further work-up of blood work.  Recommended general precautions and monitor for continued resolution, if symptoms recurring or worsening to follow-up for reevaluation.  Discussed strict return precautions. Patient verbalized understanding and is agreeable with plan.  Final Clinical Impressions(s) / UC Diagnoses   Final diagnoses:  Dizziness     Discharge Instructions     Dirnk plenty of fluids Meclizine- as needed for dizziness Logroll/BBQ and epley maneuver on youtube for continued/recurrent vertigo symptoms Follow up if not improving or worsening    ED Prescriptions    Medication Sig Dispense Auth. Provider   meclizine (ANTIVERT) 25 MG tablet Take 1 tablet (25 mg total) by mouth 3 (three) times daily as needed for dizziness. 30 tablet Aviva Wolfer, [place] C, PA-C     PDMP not reviewed this encounter.   Lew Dawes, [place] 03/02/20 1806

## 2020-03-02 NOTE — Progress Notes (Deleted)
Dizziness last night, sudden onset, lasted a few minutes, laid down and fell asleep. Felt fine this morning. Ate some grits and water this morning. Up and down ladder at work. About an hour ago, felt like he couldn't focus, lights were acting weird. Lasted about 30 minutes. No h/a, cp, sob. No sweating. No numbness or weakness. No syncope or pre-syncope. bp was normal. Blood sugar was 112

## 2020-03-02 NOTE — Progress Notes (Signed)
Subjective:     Patient ID: Warren Quinn, male   DOB: 1961-04-12, 59 y.o.   MRN: [id number]  HPI [place] presents to the employee health clinic today with complaints of dizziness and vision changes. He states last night he had a sudden onset of dizziness that lasted a few minutes, he felt like he couldn't walk straight. He laid down and fell asleep and woke up this morning feeling fine. He states about an hour ago he felt like his eyes wouldn't focus and the lights were "acting weird." He said this lasted for about 30 minutes and never fully resolved. Currently he states he still just feels a little fuzzy and not 100%. He denies any recent changes in his medications. The nurse at work checked his blood sugar and it was 112. He denies any h/a, weakness, numbness, N/V, chest pain or shortness of breath. He states he doesn't drink as much water as he should. He reports his mom had hx of stroke in her mid-60s.   Past Medical History:  Diagnosis Date  . Diabetes mellitus without complication (HCC)    No Known Allergies  Current Outpatient Medications:  .  atorvastatin (LIPITOR) 10 MG tablet, TAKE 1 TABLET BY MOUTH AT BEDTIME, Disp: , Rfl:  .  Blood Glucose Monitoring Suppl DEVI, by Does not apply route., Disp: , Rfl:  .  glucose blood test strip, 1 each by Other route 2 (two) times daily. Use as instructed, Disp: , Rfl:  .  lisinopril (ZESTRIL) 5 MG tablet, TAKE 1 TABLET (5 MG TOTAL) BY MOUTH DAILY., Disp: 30 tablet, Rfl: 1 .  metFORMIN (GLUCOPHAGE-XR) 500 MG 24 hr tablet, Take 4 tabs once a day, Disp: 120 tablet, Rfl: 1 .  mupirocin ointment (BACTROBAN) 2 %, Apply to affected area twice daily x 7 days, Disp: 22 g, Rfl: 0   Review of Systems  Constitutional: Negative for activity change, chills, fever and unexpected weight change.  HENT: Negative for congestion, ear pain, sinus pressure, sinus pain and sore throat.   Eyes: Positive for visual disturbance. Negative for photophobia, pain, discharge  and itching.  Respiratory: Negative for shortness of breath.   Cardiovascular: Negative for chest pain and palpitations.  Gastrointestinal: Negative for nausea and vomiting.  Musculoskeletal: Negative for neck pain and neck stiffness.  Neurological: Positive for dizziness. Negative for syncope, facial asymmetry, speech difficulty, weakness, numbness and headaches.       Objective:   Physical Exam Vitals reviewed.  Constitutional:      General: He is not in acute distress.    Appearance: Normal appearance. He is well-developed.  HENT:     Head: Normocephalic and atraumatic.  Eyes:     General:        Right eye: No discharge.        Left eye: No discharge.     Extraocular Movements: Extraocular movements intact.     Pupils: Pupils are equal, round, and reactive to light.  Cardiovascular:     Rate and Rhythm: Normal rate and regular rhythm.     Heart sounds: Normal heart sounds.  Pulmonary:     Effort: Pulmonary effort is normal. No respiratory distress.     Breath sounds: Normal breath sounds.  Musculoskeletal:     Cervical back: Neck supple.  Skin:    General: Skin is warm and dry.  Neurological:     General: No focal deficit present.     Mental Status: He is alert and oriented to person, place,  and time.     Motor: No weakness or pronator drift.     Coordination: Romberg sign negative. Coordination normal. Finger-Nose-Finger Test normal.     Gait: Gait is intact.  Psychiatric:        Mood and Affect: Mood normal.        Behavior: Behavior normal.    Today's Vitals   03/02/20 1203  BP: 138/90  Pulse: 74  SpO2: 96%   There is no height or weight on file to calculate BMI.      Assessment:     Vision changes      Plan:     1. Discussed with pt that I recommend he go to ED for further evaluation to rule out more serious etiologies for his symptoms of dizziness and the vision changes he experienced today. Neuro exam in office today normal. BP and glucose  slightly elevated, but not concerning. He was in agreement with this plan.

## 2020-03-02 NOTE — Discharge Instructions (Signed)
Dirnk plenty of fluids Meclizine- as needed for dizziness Logroll/BBQ and epley maneuver on youtube for continued/recurrent vertigo symptoms Follow up if not improving or worsening

## 2020-03-02 NOTE — ED Triage Notes (Signed)
PT states he started feeling dizzy last night but it resolved when he woke this morning. Pt states while at work he experienced bright spots in his vision and light headedness. His company nurse advised him to get seen. Pt is aox4 and ambulatory.

## 2020-04-18 ENCOUNTER — Other Ambulatory Visit: Payer: Self-pay | Admitting: Family Medicine

## 2020-04-18 MED ORDER — LISINOPRIL 5 MG PO TABS: ORAL_TABLET | ORAL | 0 refills | Status: DC

## 2020-04-25 ENCOUNTER — Other Ambulatory Visit: Payer: Self-pay | Admitting: Family Medicine

## 2020-04-25 ENCOUNTER — Ambulatory Visit: Payer: PRIVATE HEALTH INSURANCE | Admitting: Family Medicine

## 2020-04-25 VITALS — BP 118/80 | HR 73

## 2020-04-25 DIAGNOSIS — E119 Type 2 diabetes mellitus without complications: Secondary | ICD-10-CM

## 2020-04-25 DIAGNOSIS — E785 Hyperlipidemia, unspecified: Secondary | ICD-10-CM

## 2020-04-25 MED ORDER — ATORVASTATIN CALCIUM 10 MG PO TABS: 10.0000 mg | ORAL_TABLET | Freq: Every day | ORAL | 0 refills | Status: DC

## 2020-04-25 MED ORDER — METFORMIN HCL ER 500 MG PO TB24
ORAL_TABLET | ORAL | 1 refills | Status: DC
Start: 2020-04-25 — End: 2022-07-25

## 2020-04-25 MED ORDER — LISINOPRIL 5 MG PO TABS
ORAL_TABLET | ORAL | 0 refills | Status: DC
Start: 2020-04-25 — End: 2020-07-25

## 2020-04-25 NOTE — Progress Notes (Signed)
  Subjective:     Patient ID: Warren Quinn, male   DOB: 1961-06-06, 59 y.o.   MRN: [id number]  HPI Warren Quinn presents to the employee health and wellness clinic for lab work and refills of his current medications. He is looking to change his PCP to a location closer to his home and will get an appointment scheduled to establish care as soon as possible. He is doing well on his current medications without problem. His diabetes is under good control. He reports compliance with his current medications. He denies any adverse effects, no s/s hypoglycemia. He has no problems or concerns today.   Review of Systems  Constitutional: Negative for chills and fever.  HENT: Negative.   Eyes: Negative.   Respiratory: Negative for cough and shortness of breath.   Cardiovascular: Negative for chest pain and palpitations.  Gastrointestinal: Negative for abdominal pain, nausea and vomiting.  Musculoskeletal: Negative for myalgias.  Neurological: Negative for weakness and headaches.       Objective:   Physical Exam Vitals reviewed.  Constitutional:      General: He is not in acute distress.    Appearance: Normal appearance. He is well-developed.  HENT:     Head: Normocephalic and atraumatic.  Eyes:     General:        Right eye: No discharge.        Left eye: No discharge.  Cardiovascular:     Rate and Rhythm: Normal rate and regular rhythm.     Heart sounds: Normal heart sounds.  Pulmonary:     Effort: Pulmonary effort is normal. No respiratory distress.     Breath sounds: Normal breath sounds.  Skin:    General: Skin is warm and dry.  Neurological:     Mental Status: He is alert and oriented to person, place, and time.  Psychiatric:        Mood and Affect: Mood normal.        Behavior: Behavior normal.    Today's Vitals   04/25/20 0816  BP: 118/80  Pulse: 73  SpO2: 96%   There is no height or weight on file to calculate BMI.      Assessment:     Type 2 diabetes mellitus without  complication, without long-term current use of insulin (HCC)  Hyperlipidemia, unspecified hyperlipidemia type      Plan:     1. Diabetes has been under good control on current regimen. Last A1c 5 months ago was 6.6. Will continue current medications. Will check A1c again today.  2. Cholesterol has been under good control with current regimen. Will check lipid panel and CMP again today. Refill current medications.  3. Pt needs to establish care with PCP to take over management of his chronic conditions. He understands this and will schedule an appointment as soon as possible. Will notify pt of lab results when available. F/u in clinic prn.

## 2020-04-26 LAB — COMPLETE METABOLIC PANEL WITH GFR
AG Ratio: 2.2 (calc) (ref 1.0–2.5)
ALT: 22 U/L (ref 9–46)
AST: 16 U/L (ref 10–35)
Albumin: 4.6 g/dL (ref 3.6–5.1)
Alkaline phosphatase (APISO): 66 U/L (ref 35–144)
BUN: 20 mg/dL (ref 7–25)
CO2: 21 mmol/L (ref 20–32)
Calcium: 9.8 mg/dL (ref 8.6–10.3)
Chloride: 105 mmol/L (ref 98–110)
Creat: 0.85 mg/dL (ref 0.70–1.25)
GFR, Est African American: 109 mL/min/{1.73_m2} (ref >=60)
GFR, Est Non African American: 94 mL/min/{1.73_m2} (ref >=60)
Globulin: 2.1 g/dL (calc) (ref 1.9–3.7)
Glucose, Bld: 138 mg/dL — ABNORMAL HIGH (ref 65–99)
Potassium: 4.6 mmol/L (ref 3.5–5.3)
Sodium: 138 mmol/L (ref 135–146)
Total Bilirubin: 0.7 mg/dL (ref 0.2–1.2)
Total Protein: 6.7 g/dL (ref 6.1–8.1)

## 2020-04-26 LAB — LIPID PANEL
Cholesterol: 136 mg/dL (ref <200)
HDL: 36 mg/dL — ABNORMAL LOW (ref >=40)
LDL Cholesterol (Calc): 80 mg/dL (calc)
Non-HDL Cholesterol (Calc): 100 mg/dL (calc) (ref <130)
Total CHOL/HDL Ratio: 3.8 (calc) (ref <5.0)
Triglycerides: 104 mg/dL (ref <150)

## 2020-04-26 LAB — HEMOGLOBIN A1C W/OUT EAG: Hgb A1c MFr Bld: 7.2 % of total Hgb — ABNORMAL HIGH (ref <5.7)

## 2020-07-25 ENCOUNTER — Ambulatory Visit: Payer: PRIVATE HEALTH INSURANCE | Admitting: Family Medicine

## 2020-07-25 VITALS — BP 128/78 | HR 76

## 2020-07-25 DIAGNOSIS — E119 Type 2 diabetes mellitus without complications: Secondary | ICD-10-CM

## 2020-07-25 MED ORDER — LISINOPRIL 5 MG PO TABS
ORAL_TABLET | ORAL | 1 refills | Status: DC
Start: 2020-07-25 — End: 2021-07-12

## 2020-07-25 MED ORDER — ONETOUCH ULTRA VI STRP: ORAL_STRIP | 0 refills | Status: AC

## 2020-07-25 NOTE — Progress Notes (Signed)
Subjective:     Patient ID: Warren Quinn, male   DOB: 19-Sep-1960, 60 y.o.   MRN: [id number]  HPI Jaiquan presents to the employee health clinic today requesting a refill on his diabetes test strips and lisinopril. He is doing well with his current medication regimen. He needs to schedule an appt with his PCP, and states he is going to schedule an appt for next month. He denies any adverse effects from his medications. States he is compliant with them and feeling well overall.   Diabetes: He reports he checks his blood sugar maybe once a week, usually around 120-130 fasting. He denies any s/s hypoglycemia. He states he needs to be better about limiting his carb intake and increasing his physical activity. He is doing well on the lisinopril, no problems. Denies chest pain, shortness of breath, h/a, vision changes, peripheral neuropathy.   He is overdue for a dilated eye exam and foot exam. He will get these scheduled.   Past Medical History:  Diagnosis Date  . Diabetes mellitus without complication (HCC)    No Known Allergies  Current Outpatient Medications:  .  atorvastatin (LIPITOR) 10 MG tablet, Take 1 tablet (10 mg total) by mouth at bedtime., Disp: 90 tablet, Rfl: 0 .  glucose blood test strip, 1 each by Other route 2 (two) times daily. Use as instructed, Disp: , Rfl:  .  metFORMIN (GLUCOPHAGE-XR) 500 MG 24 hr tablet, Take 4 tabs once a day, Disp: 120 tablet, Rfl: 1 .  Blood Glucose Monitoring Suppl DEVI, by Does not apply route., Disp: , Rfl:  .  glucose blood (ONETOUCH ULTRA) test strip, USE TO CHECK BLOOD SUGAR 2 TIMES DAILY, Disp: 100 each, Rfl: 0 .  lisinopril (ZESTRIL) 5 MG tablet, TAKE 1 TABLET (5 MG TOTAL) BY MOUTH DAILY., Disp: 30 tablet, Rfl: 1  Review of Systems  Constitutional: Negative for chills, fever and unexpected weight change.  HENT: Negative.   Eyes: Negative.   Respiratory: Negative for cough and shortness of breath.   Cardiovascular: Negative for chest pain and  leg swelling.  Gastrointestinal: Negative.   Genitourinary: Negative.   Musculoskeletal: Negative.   Skin: Negative.   Neurological: Negative.        Objective:   Physical Exam Vitals reviewed.  Constitutional:      General: He is not in acute distress.    Appearance: Normal appearance. He is well-developed.  HENT:     Head: Normocephalic and atraumatic.  Eyes:     General:        Right eye: No discharge.        Left eye: No discharge.  Cardiovascular:     Rate and Rhythm: Normal rate and regular rhythm.     Heart sounds: Normal heart sounds.  Pulmonary:     Effort: Pulmonary effort is normal. No respiratory distress.     Breath sounds: Normal breath sounds.  Musculoskeletal:     Cervical back: Neck supple.  Skin:    General: Skin is warm and dry.  Neurological:     Mental Status: He is alert and oriented to person, place, and time.  Psychiatric:        Mood and Affect: Mood normal.        Behavior: Behavior normal.    Today's Vitals   07/25/20 0826  BP: 128/78  Pulse: 76  SpO2: 98%   There is no height or weight on file to calculate BMI.     Assessment:  Type 2 diabetes mellitus without complication, without long-term current use of insulin (HCC)      Plan:     1. Continue with current medication regimen. Refills given for test strips and lisinopril. Strongly encouraged f/u with his PCP and to schedule a dilated eye exam. Discussed working on limiting carbs and sugars in diet and increasing physical activity, as well as importance of monitoring blood sugars regularly. F/u here prn.

## 2021-01-23 ENCOUNTER — Ambulatory Visit: Payer: PRIVATE HEALTH INSURANCE | Admitting: Family Medicine

## 2021-01-23 ENCOUNTER — Encounter: Payer: Self-pay | Admitting: Family Medicine

## 2021-01-23 VITALS — BP 122/78 | HR 76 | Ht 70.0 in | Wt 200.0 lb

## 2021-01-23 DIAGNOSIS — Z789 Other specified health status: Secondary | ICD-10-CM

## 2021-01-23 NOTE — Progress Notes (Signed)
Subjective:     Patient ID: Warren Quinn, male   DOB: [birth year], 60 y.o.   MRN: [id number]  HPI Warren Quinn presents to the [facility] wellness clinic today for his required wellness visit for insurance. He has a PCP who he sees regularly, states he has a physical scheduled in September. His colonoscopy is UTD, done March 2022 and needs f/u in 3 years. He had his eye exam done this year. He has had 2 doses of covid-19 vaccine. He is wanting to eat healthier. States he goes to the gym available here at work a few times a week and works out.  He states he had an episode of vertigo a few weeks ago, was up on a bed looking at a light when all of a sudden the room started spinning, he got sweaty and felt terrible, states he couldn't walk, episode lasted just a few minutes. He sat down and that helped. He denies any problems since then. States that is the second time this has occurred. He has not been seen for this problem before.   Past Medical History:  Diagnosis Date   Diabetes mellitus without complication (HCC)    No Known Allergies  Current Outpatient Medications:    atorvastatin (LIPITOR) 10 MG tablet, Take 1 tablet (10 mg total) by mouth at bedtime., Disp: 90 tablet, Rfl: 0   lisinopril (ZESTRIL) 5 MG tablet, TAKE 1 TABLET (5 MG TOTAL) BY MOUTH DAILY., Disp: 30 tablet, Rfl: 1   metFORMIN (GLUCOPHAGE-XR) 500 MG 24 hr tablet, Take 4 tabs once a day, Disp: 120 tablet, Rfl: 1   Blood Glucose Monitoring Suppl DEVI, by Does not apply route., Disp: , Rfl:    glucose blood (ONETOUCH ULTRA) test strip, USE TO CHECK BLOOD SUGAR 2 TIMES DAILY, Disp: 100 each, Rfl: 0   glucose blood test strip, 1 each by Other route 2 (two) times daily. Use as instructed, Disp: , Rfl:    Review of Systems  Constitutional:  Negative for chills, fatigue, fever and unexpected weight change.  HENT:  Negative for congestion, ear pain, sinus pressure, sinus pain and sore throat.   Eyes:  Negative for discharge and  visual disturbance.  Respiratory:  Negative for cough, shortness of breath and wheezing.   Cardiovascular:  Negative for chest pain and leg swelling.  Gastrointestinal:  Negative for abdominal pain, blood in stool, constipation, diarrhea, nausea and vomiting.  Genitourinary:  Negative for difficulty urinating and hematuria.  Skin:  Negative for color change.  Neurological:  Negative for dizziness, weakness, light-headedness and headaches.  Hematological:  Negative for adenopathy.  All other systems reviewed and are negative.     Objective:   Physical Exam Vitals reviewed.  Constitutional:      General: He is not in acute distress.    Appearance: Normal appearance. He is well-developed.  HENT:     Head: Normocephalic and atraumatic.  Eyes:     General:        Right eye: No discharge.        Left eye: No discharge.  Cardiovascular:     Rate and Rhythm: Normal rate and regular rhythm.     Heart sounds: Normal heart sounds.  Pulmonary:     Effort: Pulmonary effort is normal. No respiratory distress.     Breath sounds: Normal breath sounds.  Musculoskeletal:     Cervical back: Neck supple.  Skin:    General: Skin is warm and dry.  Neurological:  Mental Status: He is alert and oriented to person, place, and time.  Psychiatric:        Mood and Affect: Mood normal.        Behavior: Behavior normal.   Today's Vitals   01/23/21 1339  BP: 122/78  Pulse: 76  SpO2: 95%  Weight: 200 lb (90.7 kg)  Height: 5\' 10"  (1.778 m)   Body mass index is 28.7 kg/m.     Assessment:     Participant in health and wellness plan     Plan:     Keep scheduled appts with PCP. Recommend shingrix vaccine, he will talk to his PCP about it. Recommend he f/u with his PCP regarding his previous episodes of vertigo. F/u here prn.

## 2021-02-27 ENCOUNTER — Ambulatory Visit: Payer: PRIVATE HEALTH INSURANCE | Admitting: Family Medicine

## 2021-02-27 DIAGNOSIS — Z23 Encounter for immunization: Secondary | ICD-10-CM

## 2021-02-27 NOTE — Progress Notes (Signed)
Shingrix vaccine given left deltoid, see immunizations for admin info. Pt tolerated well. VIS given. 2nd dose due in 2-6 months.

## 2021-06-21 ENCOUNTER — Encounter: Payer: Self-pay | Admitting: Nurse Practitioner

## 2021-06-21 ENCOUNTER — Other Ambulatory Visit: Payer: Self-pay

## 2021-06-21 ENCOUNTER — Ambulatory Visit: Payer: PRIVATE HEALTH INSURANCE | Admitting: Nurse Practitioner

## 2021-06-21 VITALS — BP 124/82 | HR 78

## 2021-06-21 DIAGNOSIS — E119 Type 2 diabetes mellitus without complications: Secondary | ICD-10-CM

## 2021-06-21 DIAGNOSIS — H6121 Impacted cerumen, right ear: Secondary | ICD-10-CM

## 2021-06-21 NOTE — Patient Instructions (Signed)
RTC in one week for cerumen disimpaction.

## 2021-06-21 NOTE — Progress Notes (Signed)
Acute Office Visit  Subjective:    Patient ID: Warren Quinn, male    DOB: Nov 04, 1960, 61 y.o.   MRN: [id number]  Patient presents with chief complaint of possible ingrown toe nail.    HPI Patient is in today to see if he had an ingrown toe nail developing or if it was something related to his diabetes. He states that he has had ingrown toe nails removed previously. He is due to see his PCP in 2 weeks, but wanted to have his feet checked to make sure it didn't "get out of hand"   Pt. Is worried that there is something else occurring related to his diabetes. He has an extensive history of diabetes in his family. Both his mother and sister have had amputations done due to their diabetes being uncontrolled. He is currently taking metformin 500 mg daily as prescribed, but recognizes that he needs to increase his exercise level and improve his diet.  He denies any pain, tingling, or numbness in his feet. He states he has full sensation in both feet and has not noticed any wounds. 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   Pt brought up vertigo episode that occurred 3-4 months ago. States he was hanging a picture on his bed and suddenly turned sweaty and experienced dizziness. Denies environmental allergies. He was prescribed meclizine, which was effective.  Pt states he favors his left ear in order to hear things.   Past Medical History:  Diagnosis Date   Diabetes mellitus without complication (HCC)     No past surgical history on file.  No family history on file.  Social History   Socioeconomic History   Marital status: Married    Spouse name: Not on file   Number of children: Not on file   Years of education: Not on file   Highest education level: Not on file  Occupational History   Not on file  Tobacco Use   Smoking status: Never   Smokeless tobacco: Never  Vaping Use    Vaping Use: Never used  Substance and Sexual Activity   Alcohol use: Not Currently   Drug use: Never   Sexual activity: Yes  Other Topics Concern   Not on file  Social History Narrative   Not on file   Social Determinants of Health   Financial Resource Strain: Not on file  Food Insecurity: Not on file  Transportation Needs: Not on file  Physical Activity: Not on file  Stress: Not on file  Social Connections: Not on file  Intimate Partner Violence: Not on file    Outpatient Medications Prior to Visit  Medication Sig Dispense Refill   atorvastatin (LIPITOR) 10 MG tablet Take 1 tablet (10 mg total) by mouth at bedtime. 90 tablet 0   Blood Glucose Monitoring Suppl DEVI by Does not apply route.     glucose blood (ONETOUCH ULTRA) test strip USE TO CHECK BLOOD SUGAR 2 TIMES DAILY 100 each 0   glucose blood test strip 1 each by Other route 2 (two) times daily. Use as instructed     lisinopril (ZESTRIL) 5 MG tablet TAKE 1 TABLET (5 MG TOTAL) BY MOUTH DAILY. 30 tablet 1   metFORMIN (GLUCOPHAGE-XR) 500 MG 24 hr tablet Take 4 tabs once a day 120 tablet 1   No facility-administered medications prior to visit.  No Known Allergies  Review of Systems  Musculoskeletal:  Negative for gait problem and joint swelling.  Skin:  Negative for color change and wound.      Objective:    Physical Exam Constitutional:      Appearance: Normal appearance.  HENT:     Head: Normocephalic and atraumatic.     Right Ear: There is impacted cerumen.     Left Ear: Tympanic membrane normal.  Cardiovascular:     Rate and Rhythm: Normal rate and regular rhythm.     Pulses: Normal pulses.          Dorsalis pedis pulses are 2+ on the right side and 2+ on the left side.     Heart sounds: Normal heart sounds.  Pulmonary:     Effort: Pulmonary effort is normal.     Breath sounds: Normal breath sounds.  Musculoskeletal:        General: No swelling or tenderness.     Right lower leg: No edema.      Left lower leg: No edema.     Right foot: No deformity.     Left foot: No deformity.     Comments: Negative for decreased sensation bilaterally   Feet:     Right foot:     Skin integrity: Skin integrity normal.     Toenail Condition: Right toenails are normal.     Left foot:     Skin integrity: Skin integrity normal.     Toenail Condition: Left toenails are abnormally thick.     Comments: Right great toe nail abnormally thick and asymmetrical from previous ingrown toe nail removal  Skin:    General: Skin is warm and dry.  Neurological:     Mental Status: He is alert.    There were no vitals taken for this visit. Wt Readings from Last 3 Encounters:  01/23/21 200 lb (90.7 kg)  08/24/19 190 lb (86.2 kg)    Health Maintenance Due  Topic Date Due   Pneumococcal Vaccine 46-30 Years old (1 - PCV) Never done   FOOT EXAM  Never done   OPHTHALMOLOGY EXAM  Never done   HIV Screening  Never done   Hepatitis C Screening  Never done   COLONOSCOPY (Pts 45-4yrs Insurance coverage will need to be confirmed)  Never done   COVID-19 Vaccine (3 - Moderna risk series) 08/07/2019   HEMOGLOBIN A1C  10/23/2020   INFLUENZA VACCINE  01/08/2021   Zoster Vaccines- Shingrix (2 of 2) 04/24/2021    There are no preventive care reminders to display for this patient.   No results found for: TSH Lab Results  Component Value Date   WBC 5.1 11/16/2019   HGB 15.4 11/16/2019   HCT 45.8 11/16/2019   MCV 89.1 11/16/2019   PLT 224 11/16/2019   Lab Results  Component Value Date   NA 138 04/25/2020   K 4.6 04/25/2020   CO2 21 04/25/2020   GLUCOSE 138 (H) 04/25/2020   BUN 20 04/25/2020   CREATININE 0.85 04/25/2020   BILITOT 0.7 04/25/2020   AST 16 04/25/2020   ALT 22 04/25/2020   PROT 6.7 04/25/2020   CALCIUM 9.8 04/25/2020   Lab Results  Component Value Date   CHOL 136 04/25/2020   Lab Results  Component Value Date   HDL 36 (L) 04/25/2020   Lab Results  Component Value Date   LDLCALC  80 04/25/2020   Lab Results  Component Value Date   TRIG 104 04/25/2020  Lab Results  Component Value Date   CHOLHDL 3.8 04/25/2020   Lab Results  Component Value Date   HGBA1C 7.2 (H) 04/25/2020       Assessment & Plan:   Problem List Items Addressed This Visit       Endocrine   Type 2 diabetes mellitus (HCC) - Primary  Stable. Continue medication regimen and follow up with PCP.  No signs of diabetic neuropathy.  Advised to soak feet in warm water and epsom salt to soften toe nail prior to cutting to prevent injury.  Advised to ensure shoes are properly fitted and not too tight during his work shifts.  Patient agreeable.  Other Visit Diagnoses     2. Impacted cerumen of right ear      Unable to disimpact during this visit. Patient had to return to work. RTC in one week for cerumen disimpaction.    No orders of the defined types were placed in this encounter.    Terie Purser, NP

## 2021-06-28 ENCOUNTER — Other Ambulatory Visit: Payer: Self-pay

## 2021-06-28 ENCOUNTER — Ambulatory Visit: Payer: PRIVATE HEALTH INSURANCE | Admitting: Nurse Practitioner

## 2021-06-28 DIAGNOSIS — H6121 Impacted cerumen, right ear: Secondary | ICD-10-CM

## 2021-06-28 NOTE — Progress Notes (Signed)
Acute Office Visit  Subjective:    Patient ID: Warren Quinn, male    DOB: Sep 27, 1960, 61 y.o.   MRN: [id number]  Chief Complaint  Patient presents with   Cerumen Impaction    HPI Patient is in today for cerumen impaction. He was seen in clinic last week. Pt. Mentioned his vertigo symptoms last visit and was found to have cerumen impaction in the right ear. He was advised to do a peroxide and warm water soak in right ear to loosen cerumen. Pt. States he put drops of peroxide and warm water in his right ear prior to appointment this morning. He states he is already hearing a bit better in the right ear.   He has an appointment with his PCP today.   Past Medical History:  Diagnosis Date   Diabetes mellitus without complication (HCC)     No past surgical history on file.  Family History  Problem Relation Age of Onset   Diabetes type II Mother    Diabetes type II Sister     Social History   Socioeconomic History   Marital status: Married    Spouse name: Not on file   Number of children: Not on file   Years of education: Not on file   Highest education level: Not on file  Occupational History   Not on file  Tobacco Use   Smoking status: Never   Smokeless tobacco: Never  Vaping Use   Vaping Use: Never used  Substance and Sexual Activity   Alcohol use: Not Currently   Drug use: Never   Sexual activity: Yes  Other Topics Concern   Not on file  Social History Narrative   Not on file   Social Determinants of Health   Financial Resource Strain: Not on file  Food Insecurity: Not on file  Transportation Needs: Not on file  Physical Activity: Not on file  Stress: Not on file  Social Connections: Not on file  Intimate Partner Violence: Not on file    Outpatient Medications Prior to Visit  Medication Sig Dispense Refill   atorvastatin (LIPITOR) 10 MG tablet Take 1 tablet (10 mg total) by mouth at bedtime. 90 tablet 0   Blood Glucose Monitoring Suppl DEVI by Does  not apply route.     glucose blood (ONETOUCH ULTRA) test strip USE TO CHECK BLOOD SUGAR 2 TIMES DAILY 100 each 0   glucose blood test strip 1 each by Other route 2 (two) times daily. Use as instructed     lisinopril (ZESTRIL) 5 MG tablet TAKE 1 TABLET (5 MG TOTAL) BY MOUTH DAILY. 30 tablet 1   metFORMIN (GLUCOPHAGE-XR) 500 MG 24 hr tablet Take 4 tabs once a day 120 tablet 1   No facility-administered medications prior to visit.    No Known Allergies  Review of Systems  HENT:  Negative for ear discharge, ear pain, hearing loss and tinnitus.       Objective:    Physical Exam Constitutional:      Appearance: Normal appearance.  HENT:     Right Ear: There is impacted cerumen.     Left Ear: Tympanic membrane normal.  Neurological:     Mental Status: He is alert.    There were no vitals taken for this visit. Wt Readings from Last 3 Encounters:  01/23/21 200 lb (90.7 kg)  08/24/19 190 lb (86.2 kg)    Health Maintenance Due  Topic Date Due   Pneumococcal Vaccine 39-95 Years old (1 -  PCV) Never done   FOOT EXAM  Never done   OPHTHALMOLOGY EXAM  Never done   HIV Screening  Never done   Hepatitis C Screening  Never done   COLONOSCOPY (Pts 45-78yrs Insurance coverage will need to be confirmed)  Never done   COVID-19 Vaccine (3 - Moderna risk series) 08/07/2019   HEMOGLOBIN A1C  10/23/2020   INFLUENZA VACCINE  01/08/2021   Zoster Vaccines- Shingrix (2 of 2) 04/24/2021    There are no preventive care reminders to display for this patient.   No results found for: TSH Lab Results  Component Value Date   WBC 5.1 11/16/2019   HGB 15.4 11/16/2019   HCT 45.8 11/16/2019   MCV 89.1 11/16/2019   PLT 224 11/16/2019   Lab Results  Component Value Date   NA 138 04/25/2020   K 4.6 04/25/2020   CO2 21 04/25/2020   GLUCOSE 138 (H) 04/25/2020   BUN 20 04/25/2020   CREATININE 0.85 04/25/2020   BILITOT 0.7 04/25/2020   AST 16 04/25/2020   ALT 22 04/25/2020   PROT 6.7 04/25/2020    CALCIUM 9.8 04/25/2020   Lab Results  Component Value Date   CHOL 136 04/25/2020   Lab Results  Component Value Date   HDL 36 (L) 04/25/2020   Lab Results  Component Value Date   LDLCALC 80 04/25/2020   Lab Results  Component Value Date   TRIG 104 04/25/2020   Lab Results  Component Value Date   CHOLHDL 3.8 04/25/2020   Lab Results  Component Value Date   HGBA1C 7.2 (H) 04/25/2020       Assessment & Plan:   Problem List Items Addressed This Visit   None Visit Diagnoses     Impacted cerumen of right ear    -  Primary  Ongoing. Disimpaction performed in clinic today. Tolerated well. RTC in 2 weeks to disimpact more cerumen from right ear. Slight irritation caused after disimpaction today.    No orders of the defined types were placed in this encounter.    Terie Purser, NP

## 2021-07-12 ENCOUNTER — Other Ambulatory Visit: Payer: Self-pay

## 2021-07-12 ENCOUNTER — Ambulatory Visit: Payer: Self-pay | Admitting: Nurse Practitioner

## 2021-07-12 DIAGNOSIS — Z7185 Encounter for immunization safety counseling: Secondary | ICD-10-CM

## 2021-07-12 DIAGNOSIS — H6121 Impacted cerumen, right ear: Secondary | ICD-10-CM

## 2021-07-12 NOTE — Progress Notes (Signed)
Acute Office Visit  Subjective:    Patient ID: Warren Quinn, male    DOB: 03/16/1961, 61 y.o.   MRN: [id number]  Chief Complaint  Patient presents with   Cerumen Impaction    HPI Patient is in today for Right ear cerumen impaction. Was in clinic 2 weeks ago for same problem. Had to return due to tenderness upon disimpaction procedure.  Pt. Denies loss of hearing, otalgia, ear discharge or worsening symptoms.   Pt. Is also asking if he is able to get his second dose of his shingrix vaccine in office. It has been about 2 months since his 1st dose. No adverse reactions from previous dose.   Past Medical History:  Diagnosis Date   Diabetes mellitus without complication (HCC)     No past surgical history on file.  Family History  Problem Relation Age of Onset   Diabetes type II Mother    Diabetes type II Sister     Social History   Socioeconomic History   Marital status: Married    Spouse name: Not on file   Number of children: Not on file   Years of education: Not on file   Highest education level: Not on file  Occupational History   Not on file  Tobacco Use   Smoking status: Never   Smokeless tobacco: Never  Vaping Use   Vaping Use: Never used  Substance and Sexual Activity   Alcohol use: Not Currently   Drug use: Never   Sexual activity: Yes  Other Topics Concern   Not on file  Social History Narrative   Not on file   Social Determinants of Health   Financial Resource Strain: Not on file  Food Insecurity: Not on file  Transportation Needs: Not on file  Physical Activity: Not on file  Stress: Not on file  Social Connections: Not on file  Intimate Partner Violence: Not on file    Outpatient Medications Prior to Visit  Medication Sig Dispense Refill   atorvastatin (LIPITOR) 10 MG tablet Take 1 tablet (10 mg total) by mouth at bedtime. 90 tablet 0   Blood Glucose Monitoring Suppl DEVI by Does not apply route.     glucose blood (ONETOUCH ULTRA) test  strip USE TO CHECK BLOOD SUGAR 2 TIMES DAILY 100 each 0   glucose blood test strip 1 each by Other route 2 (two) times daily. Use as instructed     lisinopril (ZESTRIL) 5 MG tablet TAKE 1 TABLET (5 MG TOTAL) BY MOUTH DAILY. 30 tablet 1   metFORMIN (GLUCOPHAGE-XR) 500 MG 24 hr tablet Take 4 tabs once a day 120 tablet 1   No facility-administered medications prior to visit.    No Known Allergies  Review of Systems    See HPI.  Objective:    Physical Exam Constitutional:      Appearance: Normal appearance.  HENT:     Head: Normocephalic and atraumatic.     Right Ear: Ear canal normal. There is impacted cerumen.     Left Ear: Tympanic membrane and ear canal normal.  Neurological:     Mental Status: He is alert.    There were no vitals taken for this visit. Wt Readings from Last 3 Encounters:  01/23/21 200 lb (90.7 kg)  08/24/19 190 lb (86.2 kg)    Health Maintenance Due  Topic Date Due   FOOT EXAM  Never done   OPHTHALMOLOGY EXAM  Never done   HIV Screening  Never done  Hepatitis C Screening  Never done   COLONOSCOPY (Pts 45-62yrs Insurance coverage will need to be confirmed)  Never done   COVID-19 Vaccine (3 - Moderna risk series) 08/07/2019   HEMOGLOBIN A1C  10/23/2020   Zoster Vaccines- Shingrix (2 of 2) 04/24/2021    There are no preventive care reminders to display for this patient.   No results found for: TSH Lab Results  Component Value Date   WBC 5.1 11/16/2019   HGB 15.4 11/16/2019   HCT 45.8 11/16/2019   MCV 89.1 11/16/2019   PLT 224 11/16/2019   Lab Results  Component Value Date   NA 138 04/25/2020   K 4.6 04/25/2020   CO2 21 04/25/2020   GLUCOSE 138 (H) 04/25/2020   BUN 20 04/25/2020   CREATININE 0.85 04/25/2020   BILITOT 0.7 04/25/2020   AST 16 04/25/2020   ALT 22 04/25/2020   PROT 6.7 04/25/2020   CALCIUM 9.8 04/25/2020   Lab Results  Component Value Date   CHOL 136 04/25/2020   Lab Results  Component Value Date   HDL 36 (L)  04/25/2020   Lab Results  Component Value Date   LDLCALC 80 04/25/2020   Lab Results  Component Value Date   TRIG 104 04/25/2020   Lab Results  Component Value Date   CHOLHDL 3.8 04/25/2020   Lab Results  Component Value Date   HGBA1C 7.2 (H) 04/25/2020       Assessment & Plan:   Problem List Items Addressed This Visit   None Visit Diagnoses     Impacted cerumen of right ear    -  Primary  Ongoing. Able to partially disimpact cerumen from right ear.  Pt. Tolerated procedure well.  Advised to obtain carbamide peroxide drops OTC and educated on administration.  RTC for cerumen disimpaction again, or sooner if symptoms worsen.     Immunization counseling      Pt. Due to receive 2nd dose of Shingrix vaccine. 1st dose administered 02/27/2021. No stock of vaccine available in clinic, will call pt. For appointment once vaccine is available.     Pt. Given opportunity to ask all questions and is agreeable to plan.  RTC when Shingrix vaccine available and for cerumen disimpaction of right ear.   No orders of the defined types were placed in this encounter.    Terie Purser, NP

## 2021-07-26 ENCOUNTER — Other Ambulatory Visit: Payer: Self-pay

## 2021-07-26 ENCOUNTER — Ambulatory Visit: Payer: Self-pay | Admitting: Nurse Practitioner

## 2021-07-26 ENCOUNTER — Encounter: Payer: Self-pay | Admitting: Nurse Practitioner

## 2021-07-26 DIAGNOSIS — H6121 Impacted cerumen, right ear: Secondary | ICD-10-CM

## 2021-07-26 DIAGNOSIS — Z23 Encounter for immunization: Secondary | ICD-10-CM

## 2021-07-26 NOTE — Progress Notes (Signed)
Acute Office Visit  Subjective:    Patient ID: Warren Quinn, male    DOB: July 12, 1960, 61 y.o.   MRN: [id number]  Chief Complaint  Patient presents with   Immunizations    2nd Dose Shingrix    Cerumen Impaction    HPI Patient is in today for 2nd dose of Shingrix and right ear cerumen impaction.   He received his 1st dose of Shingrix on 02/27/2021 with mild adverse reactions including muscle aches. He has tomorrow off and plans to take it easy.  Denies fevers, chills, or recent illness.   Patient obtained Debrox ear drops as advised last visit. He has been using them as directed. He is open to cerumen disimpaction today via irrigation. Denies new loss of hearing or ear pain.   Past Medical History:  Diagnosis Date   Diabetes mellitus without complication (HCC)     History reviewed. No pertinent surgical history.  Family History  Problem Relation Age of Onset   Diabetes type II Mother    Diabetes type II Sister     Social History   Socioeconomic History   Marital status: Married    Spouse name: Not on file   Number of children: Not on file   Years of education: Not on file   Highest education level: Not on file  Occupational History   Not on file  Tobacco Use   Smoking status: Never   Smokeless tobacco: Never  Vaping Use   Vaping Use: Never used  Substance and Sexual Activity   Alcohol use: Not Currently   Drug use: Never   Sexual activity: Yes  Other Topics Concern   Not on file  Social History Narrative   Not on file   Social Determinants of Health   Financial Resource Strain: Not on file  Food Insecurity: Not on file  Transportation Needs: Not on file  Physical Activity: Not on file  Stress: Not on file  Social Connections: Not on file  Intimate Partner Violence: Not on file    Outpatient Medications Prior to Visit  Medication Sig Dispense Refill   atorvastatin (LIPITOR) 10 MG tablet Take 10 mg by mouth at bedtime.     Blood Glucose  Monitoring Suppl DEVI by Does not apply route.     glucose blood (ONETOUCH ULTRA) test strip USE TO CHECK BLOOD SUGAR 2 TIMES DAILY 100 each 0   glucose blood test strip 1 each by Other route 2 (two) times daily. Use as instructed     lisinopril (ZESTRIL) 5 MG tablet Take 5 mg by mouth daily.     metFORMIN (GLUCOPHAGE-XR) 500 MG 24 hr tablet Take 4 tabs once a day 120 tablet 1   Semaglutide,0.25 or 0.5MG /DOS, (OZEMPIC, 0.25 OR 0.5 MG/DOSE,) 2 MG/1.5ML SOPN Inject 0.5 mg into the skin once a week.     No facility-administered medications prior to visit.    No Known Allergies  Review of Systems    See HPI Objective:    Physical Exam Constitutional:      Appearance: Normal appearance.  HENT:     Head: Normocephalic and atraumatic.     Right Ear: There is impacted cerumen.     Left Ear: Tympanic membrane normal.  Skin:    General: Skin is warm and dry.  Neurological:     General: No focal deficit present.     Mental Status: He is alert and oriented to person, place, and time. Mental status is at baseline.  Psychiatric:  Mood and Affect: Mood normal.        Behavior: Behavior normal.    There were no vitals taken for this visit. Wt Readings from Last 3 Encounters:  01/23/21 200 lb (90.7 kg)  08/24/19 190 lb (86.2 kg)    Health Maintenance Due  Topic Date Due   FOOT EXAM  Never done   OPHTHALMOLOGY EXAM  Never done   HIV Screening  Never done   Hepatitis C Screening  Never done   COLONOSCOPY (Pts 45-74yrs Insurance coverage will need to be confirmed)  Never done   COVID-19 Vaccine (3 - Moderna risk series) 08/07/2019   HEMOGLOBIN A1C  10/23/2020   Zoster Vaccines- Shingrix (2 of 2) 04/24/2021    There are no preventive care reminders to display for this patient.   No results found for: TSH Lab Results  Component Value Date   WBC 5.1 11/16/2019   HGB 15.4 11/16/2019   HCT 45.8 11/16/2019   MCV 89.1 11/16/2019   PLT 224 11/16/2019   Lab Results   Component Value Date   NA 138 04/25/2020   K 4.6 04/25/2020   CO2 21 04/25/2020   GLUCOSE 138 (H) 04/25/2020   BUN 20 04/25/2020   CREATININE 0.85 04/25/2020   BILITOT 0.7 04/25/2020   AST 16 04/25/2020   ALT 22 04/25/2020   PROT 6.7 04/25/2020   CALCIUM 9.8 04/25/2020   Lab Results  Component Value Date   CHOL 136 04/25/2020   Lab Results  Component Value Date   HDL 36 (L) 04/25/2020   Lab Results  Component Value Date   LDLCALC 80 04/25/2020   Lab Results  Component Value Date   TRIG 104 04/25/2020   Lab Results  Component Value Date   CHOLHDL 3.8 04/25/2020   Lab Results  Component Value Date   HGBA1C 7.2 (H) 04/25/2020       Assessment & Plan:   Problem List Items Addressed This Visit   None Visit Diagnoses     Impacted cerumen of right ear    -  Primary  Resolved. R TM normal upon visualization at the end of procedure.  Patient tolerated ear irrigation for cerumen disimpaction procedure well. Slight dizziness elicited during the procedure, but stabalized at the end of the procedure. Advised fall precautions for the following days.  Enforced education about ear hygiene with patient- no using Qtips beyond opening of ear canal.     Immunization due     2nd Dose of shingrix vaccine administered in right deltoid IM. Patient stayed 15 mins after administration with no noted acute adverse reactions. Educated patient on red flag signs and to call 911 or go to the ER if they occur. Patient agreeable.  CDC Shingrix VIS printed and given to pt.     RTC as needed or if symptoms worsen.   No orders of the defined types were placed in this encounter.    Terie Purser, NP

## 2021-08-23 ENCOUNTER — Ambulatory Visit: Payer: Self-pay | Admitting: Nurse Practitioner

## 2021-08-23 ENCOUNTER — Other Ambulatory Visit: Payer: Self-pay

## 2021-08-23 ENCOUNTER — Encounter: Payer: Self-pay | Admitting: Nurse Practitioner

## 2021-08-23 VITALS — BP 128/82 | HR 72 | Temp 98.5°F | Resp 18 | Wt 200.0 lb

## 2021-08-23 LAB — POC COVID19 BINAXNOW: SARS Coronavirus 2 Ag: NEGATIVE — NL

## 2021-08-23 MED ORDER — ONDANSETRON HCL 4 MG PO TABS: 4.0000 mg | ORAL_TABLET | Freq: Three times a day (TID) | ORAL | 0 refills | Status: AC | PRN

## 2021-08-23 NOTE — Progress Notes (Signed)
? ?Acute Office Visit ? ?Subjective:  ? ? Patient ID: Warren Quinn, male    DOB: 1960/10/27, 61 y.o.   MRN: [id number] ? ?No chief complaint on file. ? ? ?HPI ?Patient is in today for not feeling well. Warren Quinn states he woke up at 5 am this morning nauseated and ended up throwing up once. He started having these symptoms Wednesday and thought it was due to his weekly Ozempic shot. He was started on once a week subQ ozempic 3 weeks ago, usually takes it on Tuesday. He has not changed diet habits and wanted to make sure the symptoms weren't due to another reason other than the side effects of the ozempic. Denies eating out at a restaurant the past week. He had a bologna sandwich with chips for dinner and oatmeal this morning.  ?He continues to be nauseated in clinic and has a headache at 5/10 intensity. He tried to take aspirin this morning for his headache, but threw it up. He had oatmeal before work and is able to keep his fluids down, despite the nausea. The nausea is constant. He took an at home COVID test prior to work today, but did the steps out of order so he does not trust the results- it was negative.  ?Denies fevers, chills. Denies diarrhea. Denies known sick contacts.  ?Denies chest pain/discomfort, SOB, diaphoresis.  ? ?Past Medical History:  ?Diagnosis Date  ? Diabetes mellitus without complication ([place])   ? ? ?No past surgical history on file. ? ?Family History  ?Problem Relation Age of Onset  ? Diabetes type II Mother   ? Diabetes type II Sister   ? ? ?Social History  ? ?Socioeconomic History  ? Marital status: Married  ?  Spouse name: Not on file  ? Number of children: Not on file  ? Years of education: Not on file  ? Highest education level: Not on file  ?Occupational History  ? Not on file  ?Tobacco Use  ? Smoking status: Never  ? Smokeless tobacco: Never  ?Vaping Use  ? Vaping Use: Never used  ?Substance and Sexual Activity  ? Alcohol use: Not Currently  ? Drug use: Never  ? Sexual activity: Yes   ?Other Topics Concern  ? Not on file  ?Social History Narrative  ? Not on file  ? ?Social Determinants of Health  ? ?Financial Resource Strain: Not on file  ?Food Insecurity: Not on file  ?Transportation Needs: Not on file  ?Physical Activity: Not on file  ?Stress: Not on file  ?Social Connections: Not on file  ?Intimate Partner Violence: Not on file  ? ? ?Outpatient Medications Prior to Visit  ?Medication Sig Dispense Refill  ? atorvastatin (LIPITOR) 10 MG tablet Take 10 mg by mouth at bedtime.    ? Blood Glucose Monitoring Suppl DEVI by Does not apply route.    ? glucose blood (ONETOUCH ULTRA) test strip USE TO CHECK BLOOD SUGAR 2 TIMES DAILY 100 each 0  ? glucose blood test strip 1 each by Other route 2 (two) times daily. Use as instructed    ? lisinopril (ZESTRIL) 5 MG tablet Take 5 mg by mouth daily.    ? metFORMIN (GLUCOPHAGE-XR) 500 MG 24 hr tablet Take 4 tabs once a day 120 tablet 1  ? Semaglutide,0.25 or 0.5MG /DOS, (OZEMPIC, 0.25 OR 0.5 MG/DOSE,) 2 MG/1.5ML SOPN Inject 0.5 mg into the skin once a week.    ? ?No facility-administered medications prior to visit.  ? ? ?No Known  Allergies ? ?Review of Systems ? ?  See HPI ?Objective:  ?  ?Physical Exam ?Constitutional:   ?   Appearance: He is not ill-appearing, toxic-appearing or diaphoretic.  ?   Comments: Visibly fighting his nausea.   ?HENT:  ?   Head: Normocephalic and atraumatic.  ?   Right Ear: Tympanic membrane normal.  ?   Left Ear: Tympanic membrane normal.  ?   Nose: Nose normal.  ?   Mouth/Throat:  ?   Mouth: Mucous membranes are moist.  ?   Pharynx: Oropharynx is clear.  ?Eyes:  ?   Conjunctiva/sclera: Conjunctivae normal.  ?   Pupils: Pupils are equal, round, and reactive to light.  ?Cardiovascular:  ?   Rate and Rhythm: Normal rate and regular rhythm.  ?   Pulses: Normal pulses.  ?   Heart sounds: Normal heart sounds.  ?Pulmonary:  ?   Effort: Pulmonary effort is normal.  ?   Breath sounds: Normal breath sounds.  ?Abdominal:  ?   General: Abdomen  is flat. Bowel sounds are normal.  ?   Palpations: Abdomen is soft.  ?   Tenderness: There is no abdominal tenderness.  ?Musculoskeletal:     ?   General: Normal range of motion.  ?   Cervical back: Normal range of motion and neck supple.  ?Skin: ?   General: Skin is warm and dry.  ?   Capillary Refill: Capillary refill takes less than 2 seconds.  ?Neurological:  ?   General: No focal deficit present.  ?   Mental Status: He is alert and oriented to person, place, and time. Mental status is at baseline.  ?Psychiatric:     ?   Mood and Affect: Mood normal.     ?   Behavior: Behavior normal.     ?   Thought Content: Thought content normal.  ? ? ?BP 128/82 (BP Location: Left Arm, Patient Position: Sitting, Cuff Size: Normal)   Pulse 72   Temp 98.5 ?F (36.9 ?C) (Temporal)   Resp 18   Wt 200 lb (90.7 kg)   SpO2 95%   BMI 28.70 kg/m?  ?Wt Readings from Last 3 Encounters:  ?08/23/21 200 lb (90.7 kg)  ?01/23/21 200 lb (90.7 kg)  ?08/24/19 190 lb (86.2 kg)  ? ? ?Health Maintenance Due  ?Topic Date Due  ? FOOT EXAM  Never done  ? OPHTHALMOLOGY EXAM  Never done  ? HIV Screening  Never done  ? Hepatitis C Screening  Never done  ? COLONOSCOPY (Pts 45-50yrs Insurance coverage will need to be confirmed)  Never done  ? COVID-19 Vaccine (3 - Moderna risk series) 08/07/2019  ? HEMOGLOBIN A1C  10/23/2020  ? ? ?There are no preventive care reminders to display for this patient. ? ? ?No results found for: TSH ?Lab Results  ?Component Value Date  ? WBC 5.1 11/16/2019  ? HGB 15.4 11/16/2019  ? HCT 45.8 11/16/2019  ? MCV 89.1 11/16/2019  ? PLT 224 11/16/2019  ? ?Lab Results  ?Component Value Date  ? NA 138 04/25/2020  ? K 4.6 04/25/2020  ? CO2 21 04/25/2020  ? GLUCOSE 138 (H) 04/25/2020  ? BUN 20 04/25/2020  ? CREATININE 0.85 04/25/2020  ? BILITOT 0.7 04/25/2020  ? AST 16 04/25/2020  ? ALT 22 04/25/2020  ? PROT 6.7 04/25/2020  ? CALCIUM 9.8 04/25/2020  ? ?Lab Results  ?Component Value Date  ? CHOL 136 04/25/2020  ? ?Lab Results   ?Component Value  Date  ? HDL 36 (L) 04/25/2020  ? ?Lab Results  ?Component Value Date  ? [place] 80 04/25/2020  ? ?Lab Results  ?Component Value Date  ? TRIG 104 04/25/2020  ? ?Lab Results  ?Component Value Date  ? CHOLHDL 3.8 04/25/2020  ? ?Lab Results  ?Component Value Date  ? HGBA1C 7.2 (H) 04/25/2020  ? ? ?   ?Assessment & Plan:  ? ?Problem List Items Addressed This Visit   ?None ?Visit Diagnoses   ? ? Nausea and vomiting, unspecified vomiting type    -  Primary  ? Acute nonintractable headache, unspecified headache type     ? ?Ongoing. Symptoms most likely due to side effects of Ozempic subQ vs. Viral or bacterial etiology. ?Binax POC COVID test negative in clinic.  ?Discussed with patient the mechanism of action of ozempic, side effects associated to medication, and diet changes to prevent some of the side effects. Advised to drink plenty of fluids to prevent dehydration. Discussed signs and symptoms of dehydration.   ?Rx for Zofran provided in case patient needs it for symptom management. ?Advised to take tylenol or ibuprofen, dose per label, for headache.  ?RTC or go to UC if symptoms continue, or follow up with PCP about ozempic if side effects worsen.   ? ?  ? ? ? ?Meds ordered this encounter  ?Medications  ? ondansetron (ZOFRAN) 4 MG tablet  ?  Sig: Take 1 tablet (4 mg total) by mouth every 8 (eight) hours as needed for up to 7 days for nausea or vomiting.  ?  Dispense:  20 tablet  ?  Refill:  0  ?  Order Specific Question:   Supervising Provider  ?  Answer:   Jerrol Banana [id number]  ? ? ? ?Drucilla Chalet, NP ? ?

## 2022-02-28 ENCOUNTER — Encounter: Payer: Self-pay | Admitting: Nurse Practitioner

## 2022-02-28 ENCOUNTER — Ambulatory Visit: Payer: PRIVATE HEALTH INSURANCE | Admitting: Nurse Practitioner

## 2022-02-28 VITALS — BP 134/86 | HR 90

## 2022-02-28 DIAGNOSIS — Z789 Other specified health status: Secondary | ICD-10-CM

## 2022-02-28 NOTE — Progress Notes (Signed)
Office Visit   Subjective:  Patient ID: Warren Quinn, male    DOB: December 31, 1960  Age: 61 y.o. MRN: [id number]  CC: Wellness exam  HPI LUCCA BALLO presents for wellness exam visit for insurance benefit.  Patient has a PCP: Roe Coombs with [facility] significant for: T2DM on metformin and ozempic 0.25.  Last labs per PCP were completed: March 2023  Health Maintenance:  Colonoscopy: March 2022, repeat in 3 years PSA: March 2023    Smoker: Never   Immunizations:  Shingrix-  completed. Tdap: up to date. COVID- x2, no boosters  Lifestyle: Diet- recently lost 20 pounds, reports has been eating less food.  Exercise- very active at work.      Past Medical History:  Diagnosis Date   Diabetes mellitus without complication ([place])     No past surgical history on file.  Outpatient Medications Prior to Visit  Medication Sig Dispense Refill   atorvastatin (LIPITOR) 10 MG tablet Take 10 mg by mouth at bedtime.     lisinopril (ZESTRIL) 5 MG tablet Take 5 mg by mouth daily.     metFORMIN (GLUCOPHAGE-XR) 500 MG 24 hr tablet Take 4 tabs once a day 120 tablet 1   Semaglutide,0.25 or 0.5MG /DOS, (OZEMPIC, 0.25 OR 0.5 MG/DOSE,) 2 MG/1.5ML SOPN Inject 0.5 mg into the skin once a week.     Blood Glucose Monitoring Suppl DEVI by Does not apply route.     glucose blood (ONETOUCH ULTRA) test strip USE TO CHECK BLOOD SUGAR 2 TIMES DAILY 100 each 0   glucose blood test strip 1 each by Other route 2 (two) times daily. Use as instructed     No facility-administered medications prior to visit.    ROS Review of Systems  Respiratory:  Negative for shortness of breath.   Cardiovascular:  Negative for chest pain and leg swelling.  Gastrointestinal:  Negative for constipation, diarrhea and nausea.  Neurological:  Negative for dizziness and headaches.    Objective:  BP 136/86   Pulse 90   SpO2 95%   Physical Exam Constitutional:      General: He is not in acute distress. HENT:      Head: Normocephalic.  Cardiovascular:     Rate and Rhythm: Normal rate and regular rhythm.     Pulses: Normal pulses.     Heart sounds: Normal heart sounds.  Pulmonary:     Effort: Pulmonary effort is normal.     Breath sounds: Normal breath sounds.  Musculoskeletal:        General: Normal range of motion.     Right lower leg: No edema.     Left lower leg: No edema.  Skin:    General: Skin is warm.  Neurological:     General: No focal deficit present.     Mental Status: He is alert and oriented to person, place, and time.  Psychiatric:        Mood and Affect: Mood normal.        Behavior: Behavior normal.      Assessment & Plan:    Haji was seen today for wellness exam.  Diagnoses and all orders for this visit:  Participant in health and wellness plan Adult wellness physical was conducted today. Importance of diet and exercise were discussed in detail.  We reviewed immunizations and gave recommendations regarding current immunization needed for age.  Preventative health exams up to date.  Patient was advised yearly wellness exam and follow-up with PCP as scheduled.  No orders of the defined types were placed in this encounter.   No orders of the defined types were placed in this encounter.   Follow-up: as needed.

## 2022-07-25 ENCOUNTER — Ambulatory Visit: Payer: PRIVATE HEALTH INSURANCE | Admitting: Nurse Practitioner

## 2022-07-25 ENCOUNTER — Encounter: Payer: Self-pay | Admitting: Nurse Practitioner

## 2022-07-25 VITALS — BP 130/82 | HR 93 | Temp 97.5°F

## 2022-07-25 DIAGNOSIS — J3489 Other specified disorders of nose and nasal sinuses: Secondary | ICD-10-CM

## 2022-07-25 DIAGNOSIS — R110 Nausea: Secondary | ICD-10-CM

## 2022-07-25 LAB — POCT INFLUENZA A/B
Influenza A, POC: NEGATIVE
Influenza B, POC: NEGATIVE

## 2022-07-25 MED ORDER — ONDANSETRON HCL 8 MG PO TABS: 8.0000 mg | ORAL_TABLET | Freq: Three times a day (TID) | ORAL | 0 refills | Status: DC | PRN

## 2022-07-25 MED ORDER — FLUTICASONE PROPIONATE 50 MCG/ACT NA SUSP: 2.0000 | Freq: Two times a day (BID) | NASAL | 6 refills | Status: DC | PRN

## 2022-07-25 NOTE — Progress Notes (Signed)
Acute Office Visit  Subjective:     Patient ID: Warren Quinn, male    DOB: 10/28/1960, 62 y.o.   MRN: [id number]  Chief Complaint  Patient presents with   fatigue, body aches     HPI Patient present today for fatigue, body aches, and nausea Symptoms started yesterday. Reports he wasn't feeling well so he went to bed early yesterday, around 7 am. Endorses also feeling 'hot" but did not check his temperature at home. Denies chills.  Also endorses some sinus pressure, especially on forehead and mild congestion. Denies cough, SOB, or wheezing.  Has had mild nausea. Reports episode of nausea and loose stool x 2 yesterday.  Hx of type 2 DM on ozempic 0.57m weekly x 3 months. Reports blood sugars have previously been stable at home but he has not checked blood sugars recently.   Interested in [facility].   Review of Systems  Constitutional:  Positive for malaise/fatigue. Negative for chills and fever.  HENT:  Positive for congestion (slight) and sinus pain (frontal sinus pain). Negative for ear pain, sore throat and tinnitus.   Eyes:  Positive for blurred vision. Double vision: slight draining. Respiratory:  Negative for cough, shortness of breath and wheezing.   Cardiovascular:  Negative for chest pain and leg swelling.  Gastrointestinal:  Positive for nausea. Negative for constipation, diarrhea and vomiting.  Musculoskeletal:  Positive for myalgias. Negative for joint pain.  Neurological:  Positive for weakness (slightly). Negative for dizziness and headaches.        Objective:    BP 130/82   Pulse 93   Temp (!) 97.5 F (36.4 C)   SpO2 96%    Physical Exam Constitutional:      General: He is not in acute distress. HENT:     Head: Normocephalic.     Right Ear: Tympanic membrane, ear canal and external ear normal.     Left Ear: Tympanic membrane, ear canal and external ear normal.     Nose: No congestion.     Right Turbinates: Not enlarged.     Left Turbinates: Not  enlarged.     Right Sinus: Frontal sinus tenderness present. No maxillary sinus tenderness.     Left Sinus: Frontal sinus tenderness present. No maxillary sinus tenderness.  Cardiovascular:     Rate and Rhythm: Normal rate and regular rhythm.     Pulses: Normal pulses.     Heart sounds: Normal heart sounds.  Pulmonary:     Effort: Pulmonary effort is normal. No respiratory distress.     Breath sounds: Normal breath sounds.  Abdominal:     General: Bowel sounds are normal. There is no distension.     Palpations: Abdomen is soft.     Tenderness: There is no abdominal tenderness.  Musculoskeletal:        General: Normal range of motion.     Right lower leg: No edema.     Left lower leg: No edema.  Skin:    General: Skin is warm.  Neurological:     General: No focal deficit present.     Mental Status: He is alert and oriented to person, place, and time.     Results for orders placed or performed in visit on 07/25/22  Influenza A/B  Result Value Ref Range   Influenza A, POC Negative Negative   Influenza B, POC Negative Negative        Assessment & Plan:   Problem List Items Addressed This Visit  None Visit Diagnoses     Frontal sinus pain    -  Primary   Relevant Medications   fluticasone (FLONASE) 50 MCG/ACT nasal spray   Other Relevant Orders   Influenza A/B (Completed)   Nausea       Relevant Medications   ondansetron (ZOFRAN) 8 MG tablet      Patient tested negative for flu . Unfortunately, I did not have COVID test in clinic; however, patient was able to get covid testing through employer today, which was negative.  Plan for symptomatic treatment. Flonase and mucinex for sinus congestion and zofran as needed for nausea. Also encouraged to check blood sugars at home. Meds ordered this encounter  Medications   fluticasone (FLONASE) 50 MCG/ACT nasal spray    Sig: Place 2 sprays into both nostrils 2 (two) times daily as needed for allergies or rhinitis.     Dispense:  16 g    Refill:  6    Order Specific Question:   Supervising Provider    Answer:   Virginia Crews [id number]   ondansetron (ZOFRAN) 8 MG tablet    Sig: Take 1 tablet (8 mg total) by mouth every 8 (eight) hours as needed for nausea or vomiting.    Dispense:  20 tablet    Refill:  0    Order Specific Question:   Supervising Provider    Answer:   Virginia Crews [id number]    Instructed to follow-up as needed.   Lurena Joiner, NP

## 2023-01-30 ENCOUNTER — Ambulatory Visit: Payer: PRIVATE HEALTH INSURANCE | Admitting: Nurse Practitioner

## 2023-01-30 ENCOUNTER — Encounter: Payer: Self-pay | Admitting: Nurse Practitioner

## 2023-01-30 VITALS — BP 126/82 | HR 70 | Ht 70.0 in | Wt 183.0 lb

## 2023-01-30 DIAGNOSIS — Z789 Other specified health status: Secondary | ICD-10-CM

## 2023-01-30 NOTE — Progress Notes (Signed)
[facility]  Subjective:  Patient ID: Warren Quinn, male    DOB: 06/19/1960  Age: 62 y.o. MRN: [id number]  CC: Wellness exam   HPI Warren Quinn presents for wellness exam visit for insurance benefit.  Patient has a GUR:KYHC [name] PA PMH significant for: T2DM and HLD.  Last labs per PCP were completed: July 2024  Health Maintenance:  Colonoscopy: March 2022, repeat in 3 years due to polyps.   PSA: April 2024    Smoker: Never   Immunizations:  Shingrix-  completed COVID- x 2 Tdap; up to date Flu- yearly.   Lifestyle: Diet- eating smaller portions due to semiglutide.  Exercise- very active at work      Past Medical History:  Diagnosis Date   Diabetes mellitus without complication (HCC)     No past surgical history on file.  Outpatient Medications Prior to Visit  Medication Sig Dispense Refill   atorvastatin (LIPITOR) 10 MG tablet Take 10 mg by mouth at bedtime.     Blood Glucose Monitoring Suppl DEVI by Does not apply route.     glucose blood (ONETOUCH ULTRA) test strip USE TO CHECK BLOOD SUGAR 2 TIMES DAILY 100 each 0   glucose blood test strip 1 each by Other route 2 (two) times daily. Use as instructed     lisinopril (ZESTRIL) 5 MG tablet Take 5 mg by mouth daily.     Semaglutide,0.25 or 0.5MG /DOS, (OZEMPIC, 0.25 OR 0.5 MG/DOSE,) 2 MG/1.5ML SOPN Inject 0.5 mg into the skin once a week.     fluticasone (FLONASE) 50 MCG/ACT nasal spray Place 2 sprays into both nostrils 2 (two) times daily as needed for allergies or rhinitis. (Patient not taking: Reported on 01/30/2023) 16 g 6   ondansetron (ZOFRAN) 8 MG tablet Take 1 tablet (8 mg total) by mouth every 8 (eight) hours as needed for nausea or vomiting. 20 tablet 0   No facility-administered medications prior to visit.    ROS Review of Systems  Constitutional:  Negative for appetite change, fatigue and unexpected weight change.  HENT:  Negative for hearing loss.   Eyes:  Negative for visual  disturbance.  Respiratory:  Negative for shortness of breath.   Cardiovascular:  Negative for chest pain and leg swelling.  Gastrointestinal:  Negative for constipation, diarrhea, nausea and vomiting.  Musculoskeletal:  Negative for arthralgias and back pain.  Neurological:  Negative for dizziness and headaches.     Objective:  BP 126/82   Pulse 70   Ht 5\' 10"  (1.778 m)   Wt 183 lb (83 kg)   SpO2 97%   BMI 26.26 kg/m   Physical Exam Constitutional:      General: He is not in acute distress. HENT:     Head: Normocephalic.     Right Ear: Tympanic membrane, ear canal and external ear normal.     Left Ear: Tympanic membrane, ear canal and external ear normal.     Mouth/Throat:     Mouth: Mucous membranes are moist.     Pharynx: Oropharynx is clear. No oropharyngeal exudate.  Eyes:     Pupils: Pupils are equal, round, and reactive to light.  Cardiovascular:     Rate and Rhythm: Normal rate and regular rhythm.     Heart sounds: Normal heart sounds.  Pulmonary:     Effort: Pulmonary effort is normal.     Breath sounds: Normal breath sounds.  Abdominal:     General: Abdomen is flat. Bowel sounds are  normal.     Palpations: Abdomen is soft.  Musculoskeletal:        General: Normal range of motion.     Right lower leg: No edema.     Left lower leg: No edema.  Skin:    General: Skin is warm.  Neurological:     General: No focal deficit present.     Mental Status: He is alert and oriented to person, place, and time.  Psychiatric:        Mood and Affect: Mood normal.        Behavior: Behavior normal.      Assessment & Plan:    Lacy was seen today for wellness exam.  Diagnoses and all orders for this visit:  Participant in health and wellness plan   Adult wellness physical was conducted today. Importance of diet and exercise were discussed in detail.  We reviewed immunizations and gave recommendations regarding current immunization needed for age.  Preventative  health exams  are up to date.  Patient was advised yearly wellness exam  No orders of the defined types were placed in this encounter.   No orders of the defined types were placed in this encounter.   Follow-up: As needed
